# Patient Record
Sex: Female | Born: 1965 | Race: White | Hispanic: No | Marital: Married | Smoking: Never smoker
Health system: Southern US, Community
[De-identification: ages and names within clinical notes are randomized; demographics above are authoritative.]

## PROBLEM LIST (undated history)

## (undated) DIAGNOSIS — E785 Hyperlipidemia, unspecified: Secondary | ICD-10-CM

## (undated) DIAGNOSIS — R030 Elevated blood-pressure reading, without diagnosis of hypertension: Secondary | ICD-10-CM

## (undated) DIAGNOSIS — R7309 Other abnormal glucose: Secondary | ICD-10-CM

## (undated) HISTORY — DX: Hyperlipidemia, unspecified: E78.5

## (undated) HISTORY — DX: Elevated blood-pressure reading, without diagnosis of hypertension: R03.0

## (undated) HISTORY — DX: Other abnormal glucose: R73.09

---

## 1998-03-22 ENCOUNTER — Ambulatory Visit (HOSPITAL_COMMUNITY): Admission: RE | Admit: 1998-03-22 | Discharge: 1998-03-22 | Payer: Self-pay | Admitting: *Deleted

## 1998-08-27 ENCOUNTER — Other Ambulatory Visit: Admission: RE | Admit: 1998-08-27 | Discharge: 1998-08-27 | Payer: Self-pay | Admitting: Obstetrics & Gynecology

## 1999-01-30 ENCOUNTER — Other Ambulatory Visit: Admission: RE | Admit: 1999-01-30 | Discharge: 1999-01-30 | Payer: Self-pay | Admitting: Obstetrics & Gynecology

## 1999-05-02 ENCOUNTER — Ambulatory Visit (HOSPITAL_COMMUNITY): Admission: RE | Admit: 1999-05-02 | Discharge: 1999-05-02 | Payer: Self-pay | Admitting: Obstetrics and Gynecology

## 1999-05-02 ENCOUNTER — Encounter: Payer: Self-pay | Admitting: Obstetrics and Gynecology

## 1999-08-22 ENCOUNTER — Inpatient Hospital Stay (HOSPITAL_COMMUNITY): Admission: AD | Admit: 1999-08-22 | Discharge: 1999-08-25 | Payer: Self-pay | Admitting: Obstetrics & Gynecology

## 1999-10-06 ENCOUNTER — Other Ambulatory Visit: Admission: RE | Admit: 1999-10-06 | Discharge: 1999-10-06 | Payer: Self-pay | Admitting: Obstetrics & Gynecology

## 2001-05-24 ENCOUNTER — Other Ambulatory Visit: Admission: RE | Admit: 2001-05-24 | Discharge: 2001-05-24 | Payer: Self-pay | Admitting: Obstetrics & Gynecology

## 2001-05-24 ENCOUNTER — Other Ambulatory Visit: Admission: RE | Admit: 2001-05-24 | Discharge: 2001-05-24 | Payer: Self-pay | Admitting: Family Medicine

## 2001-09-14 ENCOUNTER — Ambulatory Visit (HOSPITAL_COMMUNITY): Admission: RE | Admit: 2001-09-14 | Discharge: 2001-09-14 | Payer: Self-pay | Admitting: Obstetrics & Gynecology

## 2001-11-22 ENCOUNTER — Inpatient Hospital Stay (HOSPITAL_COMMUNITY): Admission: AD | Admit: 2001-11-22 | Discharge: 2001-11-25 | Payer: Self-pay | Admitting: Obstetrics and Gynecology

## 2001-12-27 ENCOUNTER — Other Ambulatory Visit: Admission: RE | Admit: 2001-12-27 | Discharge: 2001-12-27 | Payer: Self-pay | Admitting: Obstetrics & Gynecology

## 2002-12-27 ENCOUNTER — Other Ambulatory Visit: Admission: RE | Admit: 2002-12-27 | Discharge: 2002-12-27 | Payer: Self-pay | Admitting: Obstetrics & Gynecology

## 2003-03-12 ENCOUNTER — Encounter: Payer: Self-pay | Admitting: Obstetrics & Gynecology

## 2003-03-12 ENCOUNTER — Encounter: Admission: RE | Admit: 2003-03-12 | Discharge: 2003-03-12 | Payer: Self-pay | Admitting: Obstetrics & Gynecology

## 2004-05-20 ENCOUNTER — Other Ambulatory Visit: Admission: RE | Admit: 2004-05-20 | Discharge: 2004-05-20 | Payer: Self-pay | Admitting: Obstetrics & Gynecology

## 2005-06-08 ENCOUNTER — Other Ambulatory Visit: Admission: RE | Admit: 2005-06-08 | Discharge: 2005-06-08 | Payer: Self-pay | Admitting: Obstetrics & Gynecology

## 2012-11-10 ENCOUNTER — Ambulatory Visit (HOSPITAL_COMMUNITY)
Admission: RE | Admit: 2012-11-10 | Discharge: 2012-11-10 | Disposition: A | Discharge status: Home / Self Care | Payer: 59 | Source: Ambulatory Visit | Attending: Internal Medicine | Admitting: Internal Medicine

## 2012-11-10 ENCOUNTER — Other Ambulatory Visit (HOSPITAL_COMMUNITY): Payer: Self-pay | Admitting: Internal Medicine

## 2012-11-10 DIAGNOSIS — K8020 Calculus of gallbladder without cholecystitis without obstruction: Secondary | ICD-10-CM

## 2012-11-10 DIAGNOSIS — R197 Diarrhea, unspecified: Secondary | ICD-10-CM | POA: Insufficient documentation

## 2012-11-10 DIAGNOSIS — R1032 Left lower quadrant pain: Secondary | ICD-10-CM

## 2012-11-10 DIAGNOSIS — R112 Nausea with vomiting, unspecified: Secondary | ICD-10-CM | POA: Insufficient documentation

## 2012-11-10 DIAGNOSIS — R1011 Right upper quadrant pain: Secondary | ICD-10-CM | POA: Insufficient documentation

## 2014-01-01 ENCOUNTER — Encounter: Payer: Self-pay | Admitting: Emergency Medicine

## 2014-01-01 ENCOUNTER — Ambulatory Visit (INDEPENDENT_AMBULATORY_CARE_PROVIDER_SITE_OTHER): Payer: 59 | Admitting: Emergency Medicine

## 2014-01-01 VITALS — BP 140/100 | HR 80 | Temp 98.0°F | Resp 18 | Ht 63.5 in | Wt 197.0 lb

## 2014-01-01 DIAGNOSIS — E785 Hyperlipidemia, unspecified: Secondary | ICD-10-CM

## 2014-01-01 DIAGNOSIS — R059 Cough, unspecified: Secondary | ICD-10-CM

## 2014-01-01 DIAGNOSIS — E782 Mixed hyperlipidemia: Secondary | ICD-10-CM

## 2014-01-01 DIAGNOSIS — R05 Cough: Secondary | ICD-10-CM

## 2014-01-01 DIAGNOSIS — R030 Elevated blood-pressure reading, without diagnosis of hypertension: Secondary | ICD-10-CM

## 2014-01-01 DIAGNOSIS — E559 Vitamin D deficiency, unspecified: Secondary | ICD-10-CM

## 2014-01-01 DIAGNOSIS — R6889 Other general symptoms and signs: Secondary | ICD-10-CM

## 2014-01-01 DIAGNOSIS — R7309 Other abnormal glucose: Secondary | ICD-10-CM

## 2014-01-01 LAB — CBC WITH DIFFERENTIAL/PLATELET
Basophils Absolute: 0.1 10*3/uL (ref 0.0–0.1)
Basophils Relative: 1 % (ref 0–1)
EOS PCT: 4 % (ref 0–5)
Eosinophils Absolute: 0.3 10*3/uL (ref 0.0–0.7)
HCT: 39.6 % (ref 36.0–46.0)
HEMOGLOBIN: 13.5 g/dL (ref 12.0–15.0)
LYMPHS ABS: 2.1 10*3/uL (ref 0.7–4.0)
LYMPHS PCT: 33 % (ref 12–46)
MCH: 28.4 pg (ref 26.0–34.0)
MCHC: 34.1 g/dL (ref 30.0–36.0)
MCV: 83.4 fL (ref 78.0–100.0)
MONO ABS: 0.7 10*3/uL (ref 0.1–1.0)
MONOS PCT: 10 % (ref 3–12)
Neutro Abs: 3.4 10*3/uL (ref 1.7–7.7)
Neutrophils Relative %: 52 % (ref 43–77)
Platelets: 366 10*3/uL (ref 150–400)
RBC: 4.75 MIL/uL (ref 3.87–5.11)
RDW: 13.9 % (ref 11.5–15.5)
WBC: 6.5 10*3/uL (ref 4.0–10.5)

## 2014-01-01 MED ORDER — PREDNISONE 10 MG PO TABS: ORAL_TABLET | ORAL | Status: DC

## 2014-01-01 MED ORDER — ALBUTEROL SULFATE HFA 108 (90 BASE) MCG/ACT IN AERS: 2.0000 | INHALATION_SPRAY | Freq: Four times a day (QID) | RESPIRATORY_TRACT | Status: DC | PRN

## 2014-01-01 MED ORDER — AZITHROMYCIN 250 MG PO TABS
ORAL_TABLET | ORAL | Status: AC
Start: 2014-01-01 — End: 2014-01-06

## 2014-01-01 MED ORDER — BENZONATATE 100 MG PO CAPS: 100.0000 mg | ORAL_CAPSULE | Freq: Three times a day (TID) | ORAL | Status: DC | PRN

## 2014-01-01 NOTE — Patient Instructions (Signed)
Bronchitis °Bronchitis is swelling (inflammation) of the air tubes leading to your lungs (bronchi). This causes mucus and a cough. If the swelling gets bad, you may have trouble breathing. °HOME CARE  °· Rest. °· Drink enough fluids to keep your pee (urine) clear or pale yellow (unless you have a condition where you have to watch how much you drink). °· Only take medicine as told by your doctor. If you were given antibiotic medicines, finish them even if you start to feel better. °· Avoid smoke, irritating chemicals, and strong smells. These make the problem worse. Quit smoking if you smoke. This helps your lungs heal faster. °· Use a cool mist humidifier. Change the water in the humidifier every day. You can also sit in the bathroom with hot shower running for 5 10 minutes. Keep the door closed. °· See your health care provider as told. °· Wash your hands often. °GET HELP IF: °Your problems do not get better after 1 week. °GET HELP RIGHT AWAY IF:  °· Your fever gets worse. °· You have chills. °· Your chest hurts. °· Your problems breathing get worse. °· You have blood in your mucus. °· You pass out (faint). °· You feel lightheaded. °· You have a bad headache. °· You throw up (vomit) again and again. °MAKE SURE YOU: °· Understand these instructions. °· Will watch your condition. °· Will get help right away if you are not doing well or get worse. °Document Released: 04/13/2008 Document Revised: 08/16/2013 Document Reviewed: 06/20/2013 °Exit[employer]® Patient Information ©2014 [employer]. ° °

## 2014-01-01 NOTE — Progress Notes (Signed)
   Subjective:    Patient ID: Kristie Burke, female    DOB: 1966-03-27, 48 y.o.   MRN: [id number]  HPI Comments: 48 yo female with increased chest/ head congestion over 1 week. Head has yellow/ green production. Chest feels like congestion worse in a.m. She admits to Tylenol cold/ FLU, Mucinex, Afrin no relief.   She did not f/u AD for recheck of labs from AUG 2014. LAST LABS TSH .345 BS 102 WBC 13.1 T 241 TG 174 L 135 A1C 5.3 INSULIN 43 D 34 She has added vitamins AD but did not start MF AD. She has been trying to eat better. She has been exercising more until recent illness. She had not been checking BP routinely.   Cough Associated symptoms include headaches and postnasal drip.  Headache  Associated symptoms include coughing.   MEDICATIONS Estradiol .5mg  MVIT  Allergies  Allergen Reactions  . Augmentin [Amoxicillin-Pot Clavulanate]     nausea  . Levaquin [Levofloxacin In D5w]     Palpitations    Past Medical History  Diagnosis Date  . Hyperlipidemia   . Other abnormal glucose       Review of Systems  HENT: Positive for congestion and postnasal drip.   Respiratory: Positive for cough.   Neurological: Positive for headaches.  All other systems reviewed and are negative.  BP 140/100  Pulse 80  Temp(Src) 98 F (36.7 C) (Temporal)  Resp 18  Ht 5' 3.5" (1.613 m)  Wt 197 lb (89.359 kg)  BMI 34.35 kg/m2  LMP 12/18/2013 Recheck 142/90     Objective:   Physical Exam  Nursing note and vitals reviewed. Constitutional: She is oriented to person, place, and time. She appears well-developed and well-nourished. No distress.  HENT:  Head: Normocephalic and atraumatic.  Right Ear: External ear normal.  Left Ear: External ear normal.  Nose: Nose normal.  Mouth/Throat: Oropharynx is clear and moist. No oropharyngeal exudate.  Cloudy TM's bilaterally   Eyes: Conjunctivae and EOM are normal.  Neck: Normal range of motion. Neck supple. No JVD present. No thyromegaly  present.  Cardiovascular: Normal rate, regular rhythm, normal heart sounds and intact distal pulses.   Pulmonary/Chest: Effort normal.  Tight/ congested cough ? Rhonchi  Abdominal: Soft. Bowel sounds are normal. She exhibits no distension and no mass. There is no tenderness. There is no rebound and no guarding.  Musculoskeletal: Normal range of motion. She exhibits no edema and no tenderness.  Lymphadenopathy:    She has no cervical adenopathy.  Neurological: She is alert and oriented to person, place, and time. No cranial nerve deficit.  Skin: Skin is warm and dry. No rash noted. No erythema. No pallor.  Psychiatric: She has a normal mood and affect. Her behavior is normal. Judgment and thought content normal.          Assessment & Plan:  1.  3 month F/U for elevated BP w/o HTN, Cholesterol, Pre-Dm, D. Deficient. Needs healthy diet, cardio QD and obtain healthy weight. Check Labs, Check BP if >130/80 call office 2. Cough ? Pneumonia/ Allergic rhinitis- Allegra OTC, increase H2o, allergy hygiene explained. ZPAK, Pred DP 10 mg Both AD, Tessalon perles AD, Albuterol HFA. Inhaler usage explained. D/C Afrin try Flonase AD. w/c if SX increase or ER.

## 2014-01-02 ENCOUNTER — Encounter: Payer: Self-pay | Admitting: Emergency Medicine

## 2014-01-02 DIAGNOSIS — R7309 Other abnormal glucose: Secondary | ICD-10-CM | POA: Insufficient documentation

## 2014-01-02 DIAGNOSIS — E785 Hyperlipidemia, unspecified: Secondary | ICD-10-CM | POA: Insufficient documentation

## 2014-01-02 LAB — HEPATIC FUNCTION PANEL
ALT: 18 U/L (ref 0–35)
AST: 20 U/L (ref 0–37)
Albumin: 4.3 g/dL (ref 3.5–5.2)
Alkaline Phosphatase: 84 U/L (ref 39–117)
Bilirubin, Direct: 0.1 mg/dL (ref 0.0–0.3)
Indirect Bilirubin: 0.3 mg/dL (ref 0.2–1.2)
Total Bilirubin: 0.4 mg/dL (ref 0.2–1.2)
Total Protein: 7.2 g/dL (ref 6.0–8.3)

## 2014-01-02 LAB — LIPID PANEL
Cholesterol: 215 mg/dL — ABNORMAL HIGH (ref 0–200)
HDL: 52 mg/dL (ref >39)
LDL Cholesterol: 120 mg/dL — ABNORMAL HIGH (ref 0–99)
Total CHOL/HDL Ratio: 4.1 Ratio
Triglycerides: 216 mg/dL — ABNORMAL HIGH (ref <150)
VLDL: 43 mg/dL — ABNORMAL HIGH (ref 0–40)

## 2014-01-02 LAB — HEMOGLOBIN A1C
Hgb A1c MFr Bld: 5.7 % — ABNORMAL HIGH (ref <5.7)
Mean Plasma Glucose: 117 mg/dL — ABNORMAL HIGH (ref <117)

## 2014-01-02 LAB — BASIC METABOLIC PANEL WITH GFR
BUN: 17 mg/dL (ref 6–23)
CO2: 32 mEq/L (ref 19–32)
Calcium: 9.2 mg/dL (ref 8.4–10.5)
Chloride: 103 mEq/L (ref 96–112)
Creat: 0.88 mg/dL (ref 0.50–1.10)
GFR, Est African American: 89 mL/min
GFR, Est Non African American: 77 mL/min
Glucose, Bld: 96 mg/dL (ref 70–99)
Potassium: 3.8 mEq/L (ref 3.5–5.3)
Sodium: 145 mEq/L (ref 135–145)

## 2014-01-02 LAB — MAGNESIUM: Magnesium: 2.3 mg/dL (ref 1.5–2.5)

## 2014-01-02 LAB — TSH: TSH: 1.686 u[IU]/mL (ref 0.350–4.500)

## 2014-01-02 LAB — INSULIN, FASTING: Insulin fasting, serum: 18 u[IU]/mL (ref 3–28)

## 2014-01-02 LAB — VITAMIN D 25 HYDROXY (VIT D DEFICIENCY, FRACTURES): Vit D, 25-Hydroxy: 44 ng/mL (ref 30–89)

## 2014-01-12 ENCOUNTER — Encounter: Payer: Self-pay | Admitting: *Deleted

## 2014-01-19 ENCOUNTER — Ambulatory Visit (INDEPENDENT_AMBULATORY_CARE_PROVIDER_SITE_OTHER): Payer: 59 | Admitting: Emergency Medicine

## 2014-01-19 ENCOUNTER — Encounter: Payer: Self-pay | Admitting: Emergency Medicine

## 2014-01-19 VITALS — BP 120/88 | HR 72 | Temp 98.0°F | Resp 16 | Ht 63.5 in | Wt 198.0 lb

## 2014-01-19 DIAGNOSIS — J309 Allergic rhinitis, unspecified: Secondary | ICD-10-CM

## 2014-01-19 DIAGNOSIS — R030 Elevated blood-pressure reading, without diagnosis of hypertension: Secondary | ICD-10-CM

## 2014-01-19 NOTE — Progress Notes (Signed)
   Subjective:    Patient ID: Kristie Burke, female    DOB: [birth year], 48 y.o.   MRN: [id number]  HPI Comments: 48 yo female for close f/u with BP. She notes 110s/ 80s at home. She denies any CV complaints. She has tried to improve diet/ exercise.   She is still with thick mucus but clear from chest. She has headaches on/ off in right side. She has runny nose mostly at work. She has continued Tessalon and increased H2o. She d/c nose spray. She notes + improvement from previous visit.    Medication List       This list is accurate as of: 01/19/14  9:40 AM.  Always use your most recent med list.               albuterol 108 (90 BASE) MCG/ACT inhaler  Commonly known as:  PROVENTIL HFA;VENTOLIN HFA  Inhale 2 puffs into the lungs every 6 (six) hours as needed for wheezing or shortness of breath.     benzonatate 100 MG capsule  Commonly known as:  TESSALON PERLES  Take 1 capsule (100 mg total) by mouth 3 (three) times daily as needed.     estradiol 2 MG tablet  Commonly known as:  ESTRACE  Take 2 mg by mouth 2 (two) times daily.     multivitamin with minerals tablet  Take 1 tablet by mouth daily.         Allergies  Allergen Reactions  . Augmentin [Amoxicillin-Pot Clavulanate]     nausea  . Levaquin [Levofloxacin In D5w]     Palpitations   Past Medical History  Diagnosis Date  . Hyperlipidemia   . Other abnormal glucose      Review of Systems  HENT: Positive for congestion and postnasal drip.   All other systems reviewed and are negative.   BP 120/88  Pulse 72  Temp(Src) 98 F (36.7 C) (Temporal)  Resp 16  Ht 5' 3.5" (1.613 m)  Wt 198 lb (89.812 kg)  BMI 34.52 kg/m2  LMP 12/18/2013     Objective:   Physical Exam  Nursing note and vitals reviewed. Constitutional: She is oriented to person, place, and time. She appears well-developed and well-nourished.  HENT:  Head: Normocephalic and atraumatic.  Right Ear: External ear normal.  Left Ear: External  ear normal.  Nose: Nose normal.  Mouth/Throat: Oropharynx is clear and moist. No oropharyngeal exudate.  Cloudy TM's bilaterally   Eyes: Conjunctivae and EOM are normal.  Neck: Normal range of motion.  Cardiovascular: Normal rate, regular rhythm, normal heart sounds and intact distal pulses.   Pulmonary/Chest: Effort normal and breath sounds normal.  Musculoskeletal: Normal range of motion.  Lymphadenopathy:    She has no cervical adenopathy.  Neurological: She is alert and oriented to person, place, and time.  Skin: Skin is warm and dry.  Psychiatric: She has a normal mood and affect. Judgment normal.          Assessment & Plan:  1. HTN- Check BP call if >130/80, increase cardio  2.Allergic rhinitis- Allegra OTC, increase H2o, allergy hygiene explained. Add Flonase QOD. If sinusitis symptoms continue may need further evaluation with ENT/ CT [facility]

## 2014-01-19 NOTE — Patient Instructions (Signed)
Allergic Rhinitis Allergic rhinitis is when the mucous membranes in the nose respond to allergens. Allergens are particles in the air that cause your body to have an allergic reaction. This causes you to release allergic antibodies. Through a chain of events, these eventually cause you to release histamine into the blood stream. Although meant to protect the body, it is this release of histamine that causes your discomfort, such as frequent sneezing, congestion, and an itchy, runny nose.  CAUSES  Seasonal allergic rhinitis (hay fever) is caused by pollen allergens that may come from grasses, trees, and weeds. Year-round allergic rhinitis (perennial allergic rhinitis) is caused by allergens such as house dust mites, pet dander, and mold spores.  SYMPTOMS   Nasal stuffiness (congestion).  Itchy, runny nose with sneezing and tearing of the eyes. DIAGNOSIS  Your health care provider can help you determine the allergen or allergens that trigger your symptoms. If you and your health care provider are unable to determine the allergen, skin or blood testing may be used. TREATMENT  Allergic Rhinitis does not have a cure, but it can be controlled by:  Medicines and allergy shots (immunotherapy).  Avoiding the allergen. Hay fever may often be treated with antihistamines in pill or nasal spray forms. Antihistamines block the effects of histamine. There are over-the-counter medicines that may help with nasal congestion and swelling around the eyes. Check with your health care provider before taking or giving this medicine.  If avoiding the allergen or the medicine prescribed do not work, there are many new medicines your health care provider can prescribe. Stronger medicine may be used if initial measures are ineffective. Desensitizing injections can be used if medicine and avoidance does not work. Desensitization is when a patient is given ongoing shots until the body becomes less sensitive to the allergen.  Make sure you follow up with your health care provider if problems continue. HOME CARE INSTRUCTIONS It is not possible to completely avoid allergens, but you can reduce your symptoms by taking steps to limit your exposure to them. It helps to know exactly what you are allergic to so that you can avoid your specific triggers. SEEK MEDICAL CARE IF:   You have a fever.  You develop a cough that does not stop easily (persistent).  You have shortness of breath.  You start wheezing.  Symptoms interfere with normal daily activities. Document Released: 07/21/2001 Document Revised: 08/16/2013 Document Reviewed: 07/03/2013 [employer] Patient Information 2014 [employer].  

## 2014-01-20 ENCOUNTER — Encounter: Payer: Self-pay | Admitting: Emergency Medicine

## 2014-01-20 DIAGNOSIS — R030 Elevated blood-pressure reading, without diagnosis of hypertension: Secondary | ICD-10-CM

## 2014-01-20 HISTORY — DX: Elevated blood-pressure reading, without diagnosis of hypertension: R03.0

## 2014-04-24 ENCOUNTER — Ambulatory Visit (INDEPENDENT_AMBULATORY_CARE_PROVIDER_SITE_OTHER): Payer: 59 | Admitting: Emergency Medicine

## 2014-04-24 ENCOUNTER — Encounter: Payer: Self-pay | Admitting: Emergency Medicine

## 2014-04-24 VITALS — BP 130/92 | HR 74 | Temp 97.8°F | Resp 18 | Ht 63.5 in | Wt 199.0 lb

## 2014-04-24 DIAGNOSIS — R030 Elevated blood-pressure reading, without diagnosis of hypertension: Secondary | ICD-10-CM

## 2014-04-24 DIAGNOSIS — R7309 Other abnormal glucose: Secondary | ICD-10-CM

## 2014-04-24 DIAGNOSIS — J309 Allergic rhinitis, unspecified: Secondary | ICD-10-CM

## 2014-04-24 DIAGNOSIS — E782 Mixed hyperlipidemia: Secondary | ICD-10-CM

## 2014-04-24 LAB — CBC WITH DIFFERENTIAL/PLATELET
BASOS ABS: 0 10*3/uL (ref 0.0–0.1)
Basophils Relative: 0 % (ref 0–1)
Eosinophils Absolute: 0.1 10*3/uL (ref 0.0–0.7)
Eosinophils Relative: 2 % (ref 0–5)
HCT: 39 % (ref 36.0–46.0)
HEMOGLOBIN: 13.4 g/dL (ref 12.0–15.0)
LYMPHS PCT: 31 % (ref 12–46)
Lymphs Abs: 1.9 10*3/uL (ref 0.7–4.0)
MCH: 29.2 pg (ref 26.0–34.0)
MCHC: 34.4 g/dL (ref 30.0–36.0)
MCV: 85 fL (ref 78.0–100.0)
MONOS PCT: 12 % (ref 3–12)
Monocytes Absolute: 0.7 10*3/uL (ref 0.1–1.0)
NEUTROS ABS: 3.4 10*3/uL (ref 1.7–7.7)
NEUTROS PCT: 55 % (ref 43–77)
Platelets: 324 10*3/uL (ref 150–400)
RBC: 4.59 MIL/uL (ref 3.87–5.11)
RDW: 14.9 % (ref 11.5–15.5)
WBC: 6.1 10*3/uL (ref 4.0–10.5)

## 2014-04-24 LAB — HEMOGLOBIN A1C
HEMOGLOBIN A1C: 5.5 % (ref <5.7)
MEAN PLASMA GLUCOSE: 111 mg/dL (ref <117)

## 2014-04-24 NOTE — Patient Instructions (Signed)
Fat and Cholesterol Control Diet  Fat and cholesterol levels in your blood and organs are influenced by your diet. High levels of fat and cholesterol may lead to diseases of the heart, small and large blood vessels, gallbladder, liver, and pancreas.  CONTROLLING FAT AND CHOLESTEROL WITH DIET  Although exercise and lifestyle factors are important, your diet is key. That is because certain foods are known to raise cholesterol and others to lower it. The goal is to balance foods for their effect on cholesterol and more importantly, to replace saturated and trans fat with other types of fat, such as monounsaturated fat, polyunsaturated fat, and omega-3 fatty acids.  On average, a person should consume no more than 15 to 17 g of saturated fat daily. Saturated and trans fats are considered "bad" fats, and they will raise LDL cholesterol. Saturated fats are primarily found in animal products such as meats, butter, and cream. However, that does not mean you need to give up all your favorite foods. Today, there are good tasting, low-fat, low-cholesterol substitutes for most of the things you like to eat. Choose low-fat or nonfat alternatives. Choose round or loin cuts of red meat. These types of cuts are lowest in fat and cholesterol. Chicken (without the skin), fish, veal, and ground turkey breast are great choices. Eliminate fatty meats, such as hot dogs and salami. Even shellfish have little or no saturated fat. Have a 3 oz (85 g) portion when you eat lean meat, poultry, or fish.  Trans fats are also called "partially hydrogenated oils." They are oils that have been scientifically manipulated so that they are solid at room temperature resulting in a longer shelf life and improved taste and texture of foods in which they are added. Trans fats are found in stick margarine, some tub margarines, cookies, crackers, and baked goods.   When baking and cooking, oils are a great substitute for butter. The monounsaturated oils are  especially beneficial since it is believed they lower LDL and raise HDL. The oils you should avoid entirely are saturated tropical oils, such as coconut and palm.   Remember to eat a lot from food groups that are naturally free of saturated and trans fat, including fish, fruit, vegetables, beans, grains (barley, rice, couscous, bulgur wheat), and pasta (without cream sauces).   IDENTIFYING FOODS THAT LOWER FAT AND CHOLESTEROL   Soluble fiber may lower your cholesterol. This type of fiber is found in fruits such as apples, vegetables such as broccoli, potatoes, and carrots, legumes such as beans, peas, and lentils, and grains such as barley. Foods fortified with plant sterols (phytosterol) may also lower cholesterol. You should eat at least 2 g per day of these foods for a cholesterol lowering effect.   Read package labels to identify low-saturated fats, trans fat free, and low-fat foods at the supermarket. Select cheeses that have only 2 to 3 g saturated fat per ounce. Use a heart-healthy tub margarine that is free of trans fats or partially hydrogenated oil. When buying baked goods (cookies, crackers), avoid partially hydrogenated oils. Breads and muffins should be made from whole grains (whole-wheat or whole oat flour, instead of "flour" or "enriched flour"). Buy non-creamy canned soups with reduced salt and no added fats.   FOOD PREPARATION TECHNIQUES   Never deep-fry. If you must fry, either stir-fry, which uses very little fat, or use non-stick cooking sprays. When possible, broil, bake, or roast meats, and steam vegetables. Instead of putting butter or margarine on vegetables, use lemon   and herbs, applesauce, and cinnamon (for squash and sweet potatoes). Use nonfat yogurt, salsa, and low-fat dressings for salads.   LOW-SATURATED FAT / LOW-FAT FOOD SUBSTITUTES  Meats / Saturated Fat (g)  · Avoid: Steak, marbled (3 oz/85 g) / 11 g  · Choose: Steak, lean (3 oz/85 g) / 4 g  · Avoid: Hamburger (3 oz/85 g) / 7  g  · Choose: Hamburger, lean (3 oz/85 g) / 5 g  · Avoid: Ham (3 oz/85 g) / 6 g  · Choose: Ham, lean cut (3 oz/85 g) / 2.4 g  · Avoid: Chicken, with skin, dark meat (3 oz/85 g) / 4 g  · Choose: Chicken, skin removed, dark meat (3 oz/85 g) / 2 g  · Avoid: Chicken, with skin, light meat (3 oz/85 g) / 2.5 g  · Choose: Chicken, skin removed, light meat (3 oz/85 g) / 1 g  Dairy / Saturated Fat (g)  · Avoid: Whole milk (1 cup) / 5 g  · Choose: Low-fat milk, 2% (1 cup) / 3 g  · Choose: Low-fat milk, 1% (1 cup) / 1.5 g  · Choose: Skim milk (1 cup) / 0.3 g  · Avoid: Hard cheese (1 oz/28 g) / 6 g  · Choose: Skim milk cheese (1 oz/28 g) / 2 to 3 g  · Avoid: Cottage cheese, 4% fat (1 cup) / 6.5 g  · Choose: Low-fat cottage cheese, 1% fat (1 cup) / 1.5 g  · Avoid: Ice cream (1 cup) / 9 g  · Choose: Sherbet (1 cup) / 2.5 g  · Choose: Nonfat frozen yogurt (1 cup) / 0.3 g  · Choose: Frozen fruit bar / trace  · Avoid: Whipped cream (1 tbs) / 3.5 g  · Choose: Nondairy whipped topping (1 tbs) / 1 g  Condiments / Saturated Fat (g)  · Avoid: Mayonnaise (1 tbs) / 2 g  · Choose: Low-fat mayonnaise (1 tbs) / 1 g  · Avoid: Butter (1 tbs) / 7 g  · Choose: Extra light margarine (1 tbs) / 1 g  · Avoid: Coconut oil (1 tbs) / 11.8 g  · Choose: Olive oil (1 tbs) / 1.8 g  · Choose: Corn oil (1 tbs) / 1.7 g  · Choose: Safflower oil (1 tbs) / 1.2 g  · Choose: Sunflower oil (1 tbs) / 1.4 g  · Choose: Soybean oil (1 tbs) / 2.4 g  · Choose: Canola oil (1 tbs) / 1 g  Document Released: 10/26/2005 Document Revised: 02/20/2013 Document Reviewed: 04/16/2011  [employer]® Patient Information ©2014 [employer].

## 2014-04-24 NOTE — Progress Notes (Signed)
Subjective:    Patient ID: Kristie Burke, female    DOB: February 07, 1966, 48 y.o.   MRN: [id number]  HPI Comments: 48 yo WF presents for 3 month F/U for HTN, Cholesterol, Pre-Dm, D. Deficient. She does not check BP routinely. She is eating healthier. She is keeping busy  She has increased allergy drainage over last week. She has been using saline. She has not been Flonase AD. She has been using Benadryl allergy without relief.  WBC             6.5   01/01/2014 HGB            13.5   01/01/2014 HCT            39.6   01/01/2014 PLT             366   01/01/2014 GLUCOSE          96   01/01/2014 CHOL            215   01/01/2014 TRIG            216   01/01/2014 HDL              52   01/01/2014 LDLCALC         120   01/01/2014 ALT              18   01/01/2014 AST              20   01/01/2014 NA              145   01/01/2014 K               3.8   01/01/2014 CL              103   01/01/2014 CREATININE     0.88   01/01/2014 BUN              17   01/01/2014 CO2              32   01/01/2014 TSH           1.686   01/01/2014 HGBA1C          5.7   01/01/2014   Hyperlipidemia  Hypertension     Medication List       This list is accurate as of: 04/24/14  9:20 AM.  Always use your most recent med list.               cetirizine 10 MG tablet  Commonly known as:  ZYRTEC  Take 10 mg by mouth daily.     estradiol 2 MG tablet  Commonly known as:  ESTRACE  Take 2 mg by mouth 2 (two) times daily.     multivitamin with minerals tablet  Take 1 tablet by mouth daily.       Allergies  Allergen Reactions  . Augmentin [Amoxicillin-Pot Clavulanate]     nausea  . Levaquin [Levofloxacin In D5w]     Palpitations   Past Medical History  Diagnosis Date  . Hyperlipidemia   . Other abnormal glucose   . Elevated blood pressure reading without diagnosis of hypertension 01/20/2014      Review of Systems  HENT: Positive for congestion and postnasal drip.   Respiratory: Positive for cough.   All other  systems reviewed and are negative.  BP 130/92  Pulse 74  Temp(Src) 97.8 F (36.6 C) (Temporal)  Resp 18  Ht 5' 3.5" (1.613 m)  Wt 199 lb (90.266 kg)  BMI 34.69 kg/m2  LMP 04/10/2014     Objective:   Physical Exam  Nursing note and vitals reviewed. Constitutional: She is oriented to person, place, and time. She appears well-developed and well-nourished. No distress.  HENT:  Head: Normocephalic and atraumatic.  Right Ear: External ear normal.  Left Ear: External ear normal.  Nose: Nose normal.  Mouth/Throat: Oropharynx is clear and moist. No oropharyngeal exudate.  Cloudy TM's bilaterally   Eyes: Conjunctivae and EOM are normal.  Neck: Normal range of motion. Neck supple. No JVD present. No thyromegaly present.  Cardiovascular: Normal rate, regular rhythm, normal heart sounds and intact distal pulses.   Pulmonary/Chest: Effort normal and breath sounds normal.  Abdominal: Soft. Bowel sounds are normal. She exhibits no distension. There is no tenderness.  Musculoskeletal: Normal range of motion. She exhibits no edema and no tenderness.  Lymphadenopathy:    She has no cervical adenopathy.  Neurological: She is alert and oriented to person, place, and time. No cranial nerve deficit.  Skin: Skin is warm and dry. No rash noted. No erythema. No pallor.  Psychiatric: She has a normal mood and affect. Her behavior is normal. Judgment and thought content normal.          Assessment & Plan:  1.  3 month F/U for HTN, Cholesterol, Pre-Dm, D. Deficient. Needs healthy diet, cardio QD and obtain healthy weight. Check Labs, Check BP if >130/80 call office   2. Allergic rhinitis- Zyrtec/ Allegra OTC, increase H2o, allergy hygiene explained. Add Flonase NS AD

## 2014-04-25 LAB — LIPID PANEL
Cholesterol: 258 mg/dL — ABNORMAL HIGH (ref 0–200)
HDL: 57 mg/dL (ref >39)
LDL CALC: 162 mg/dL — AB (ref 0–99)
Total CHOL/HDL Ratio: 4.5 Ratio
Triglycerides: 195 mg/dL — ABNORMAL HIGH (ref <150)
VLDL: 39 mg/dL (ref 0–40)

## 2014-04-25 LAB — BASIC METABOLIC PANEL WITH GFR
BUN: 21 mg/dL (ref 6–23)
CO2: 27 meq/L (ref 19–32)
Calcium: 9.4 mg/dL (ref 8.4–10.5)
Chloride: 104 mEq/L (ref 96–112)
Creat: 0.91 mg/dL (ref 0.50–1.10)
GFR, EST AFRICAN AMERICAN: 85 mL/min
GFR, Est Non African American: 74 mL/min
Glucose, Bld: 94 mg/dL (ref 70–99)
Potassium: 4.8 mEq/L (ref 3.5–5.3)
Sodium: 141 mEq/L (ref 135–145)

## 2014-04-25 LAB — HEPATIC FUNCTION PANEL
ALT: 12 U/L (ref 0–35)
AST: 17 U/L (ref 0–37)
Albumin: 4.1 g/dL (ref 3.5–5.2)
Alkaline Phosphatase: 65 U/L (ref 39–117)
BILIRUBIN DIRECT: 0.1 mg/dL (ref 0.0–0.3)
BILIRUBIN INDIRECT: 0.5 mg/dL (ref 0.2–1.2)
Total Bilirubin: 0.6 mg/dL (ref 0.2–1.2)
Total Protein: 6.9 g/dL (ref 6.0–8.3)

## 2014-04-25 LAB — INSULIN, FASTING: Insulin fasting, serum: 13 u[IU]/mL (ref 3–28)

## 2014-08-03 ENCOUNTER — Encounter: Payer: Self-pay | Admitting: Physician Assistant

## 2014-08-03 ENCOUNTER — Ambulatory Visit (INDEPENDENT_AMBULATORY_CARE_PROVIDER_SITE_OTHER): Payer: 59 | Admitting: Physician Assistant

## 2014-08-03 VITALS — BP 120/78 | HR 76 | Temp 98.1°F | Resp 16 | Ht 63.0 in | Wt 177.0 lb

## 2014-08-03 DIAGNOSIS — Z79899 Other long term (current) drug therapy: Secondary | ICD-10-CM | POA: Insufficient documentation

## 2014-08-03 DIAGNOSIS — E785 Hyperlipidemia, unspecified: Secondary | ICD-10-CM

## 2014-08-03 DIAGNOSIS — E669 Obesity, unspecified: Secondary | ICD-10-CM

## 2014-08-03 DIAGNOSIS — R7309 Other abnormal glucose: Secondary | ICD-10-CM

## 2014-08-03 DIAGNOSIS — R030 Elevated blood-pressure reading, without diagnosis of hypertension: Secondary | ICD-10-CM

## 2014-08-03 DIAGNOSIS — E559 Vitamin D deficiency, unspecified: Secondary | ICD-10-CM

## 2014-08-03 LAB — HEMOGLOBIN A1C
Hgb A1c MFr Bld: 5.7 % — ABNORMAL HIGH (ref <5.7)
Mean Plasma Glucose: 117 mg/dL — ABNORMAL HIGH (ref <117)

## 2014-08-03 LAB — LIPID PANEL
CHOLESTEROL: 169 mg/dL (ref 0–200)
HDL: 46 mg/dL (ref >39)
LDL Cholesterol: 99 mg/dL (ref 0–99)
Total CHOL/HDL Ratio: 3.7 Ratio
Triglycerides: 121 mg/dL (ref <150)
VLDL: 24 mg/dL (ref 0–40)

## 2014-08-03 LAB — BASIC METABOLIC PANEL WITH GFR
BUN: 9 mg/dL (ref 6–23)
CALCIUM: 9.1 mg/dL (ref 8.4–10.5)
CHLORIDE: 104 meq/L (ref 96–112)
CO2: 29 meq/L (ref 19–32)
Creat: 0.84 mg/dL (ref 0.50–1.10)
GFR, Est African American: >89 mL/min
GFR, Est Non African American: 81 mL/min
Glucose, Bld: 92 mg/dL (ref 70–99)
POTASSIUM: 4 meq/L (ref 3.5–5.3)
SODIUM: 143 meq/L (ref 135–145)

## 2014-08-03 LAB — HEPATIC FUNCTION PANEL
ALK PHOS: 49 U/L (ref 39–117)
ALT: 36 U/L — ABNORMAL HIGH (ref 0–35)
AST: 33 U/L (ref 0–37)
Albumin: 3.9 g/dL (ref 3.5–5.2)
BILIRUBIN TOTAL: 0.5 mg/dL (ref 0.2–1.2)
Bilirubin, Direct: 0.1 mg/dL (ref 0.0–0.3)
Indirect Bilirubin: 0.4 mg/dL (ref 0.2–1.2)
Total Protein: 6.5 g/dL (ref 6.0–8.3)

## 2014-08-03 LAB — CBC WITH DIFFERENTIAL/PLATELET
Basophils Absolute: 0 10*3/uL (ref 0.0–0.1)
Basophils Relative: 0 % (ref 0–1)
Eosinophils Absolute: 0.2 10*3/uL (ref 0.0–0.7)
Eosinophils Relative: 3 % (ref 0–5)
HCT: 38.1 % (ref 36.0–46.0)
HEMOGLOBIN: 12.8 g/dL (ref 12.0–15.0)
LYMPHS ABS: 1.6 10*3/uL (ref 0.7–4.0)
LYMPHS PCT: 27 % (ref 12–46)
MCH: 28.6 pg (ref 26.0–34.0)
MCHC: 33.6 g/dL (ref 30.0–36.0)
MCV: 85 fL (ref 78.0–100.0)
Monocytes Absolute: 0.7 10*3/uL (ref 0.1–1.0)
Monocytes Relative: 12 % (ref 3–12)
NEUTROS ABS: 3.5 10*3/uL (ref 1.7–7.7)
NEUTROS PCT: 58 % (ref 43–77)
Platelets: 347 10*3/uL (ref 150–400)
RBC: 4.48 MIL/uL (ref 3.87–5.11)
RDW: 14.9 % (ref 11.5–15.5)
WBC: 6 10*3/uL (ref 4.0–10.5)

## 2014-08-03 LAB — MAGNESIUM: Magnesium: 2.4 mg/dL (ref 1.5–2.5)

## 2014-08-03 LAB — TSH: TSH: 2.185 u[IU]/mL (ref 0.350–4.500)

## 2014-08-03 NOTE — Patient Instructions (Signed)
Almond crusted tilapia Blend almond with cocunut or almond flour, add paprika, italian, garlic, ground yellow mustard and bake it. Can dip in spicy mustard or put on salad.  Salmon 2 ways Marinate in bag 1) lite soy, ginger, honey pair with broccoli/green beads/edameme 2) lite soy, small amount honey, LIME juice, garlic, paprika, chilli powder- can pair with salsa/salad  Keep up the GREAT work

## 2014-08-03 NOTE — Progress Notes (Signed)
Assessment and Plan:  Hypertension: Continue medication, monitor blood pressure at home. Continue DASH diet. Cholesterol: Continue diet and exercise. Check cholesterol.  Pre-diabetes-Continue diet and exercise. Check A1C Vitamin D Def- check level and continue medications.  Obesity with co morbidities- long discussion about weight loss, diet, and exercise  Continue diet and meds as discussed. Further disposition pending results of labs.  HPI 48 y.o. female  presents for 3 month follow up with hypertension, hyperlipidemia, prediabetes and vitamin D. Her blood pressure has been controlled at home, today their BP is BP: 120/78 mmHg She does workout. She denies chest pain, shortness of breath, dizziness.  She is not on cholesterol medication and denies myalgias. Her cholesterol is not at goal. The cholesterol last visit was:   Lab Results  Component Value Date   CHOL 258* 04/24/2014   HDL 57 04/24/2014   LDLCALC 162* 04/24/2014   TRIG 195* 04/24/2014   CHOLHDL 4.5 04/24/2014   She has been working on diet and exercise for prediabetes, and denies paresthesia of the feet, polydipsia and polyuria. Last A1C in the office was:  Lab Results  Component Value Date   HGBA1C 5.5 04/24/2014   Patient is on Vitamin D supplement.   Lab Results  Component Value Date   VD25OH 44 01/01/2014     BMI is Body mass index is 31.36 kg/(m^2).,she is working on diet and exercise and has done well. She did a weight loss program.  Wt Readings from Last 3 Encounters:  08/03/14 177 lb (80.287 kg)  04/24/14 199 lb (90.266 kg)  01/19/14 198 lb (89.812 kg)    Current Medications:  Current Outpatient Prescriptions on File Prior to Visit  Medication Sig Dispense Refill  . cetirizine (ZYRTEC) 10 MG tablet Take 10 mg by mouth daily.      Marland Kitchen estradiol (ESTRACE) 2 MG tablet Take 2 mg by mouth 2 (two) times daily.      . Multiple Vitamins-Minerals (MULTIVITAMIN WITH MINERALS) tablet Take 1 tablet by mouth daily.        No current facility-administered medications on file prior to visit.   Medical History:  Past Medical History  Diagnosis Date  . Hyperlipidemia   . Other abnormal glucose   . Elevated blood pressure reading without diagnosis of hypertension 01/20/2014   Allergies:  Allergies  Allergen Reactions  . Augmentin [Amoxicillin-Pot Clavulanate]     nausea  . Levaquin [Levofloxacin In D5w]     Palpitations     Review of Systems:  = complains of   = denies  General: Fatigue  Fever  Chills  Weakness   Insomnia  Eyes: Redness  Blurred vision  Diplopia   ENT: Congestion  Sinus Pain  Post Nasal Drip  Sore Throat  Earache   Cardiac: Chest pain/pressure  SOB  Orthopnea   Palpitations   Paroxysmal nocturnal dyspnea[ ]  Claudication  Edema   Pulmonary: Cough  Wheezing[ ]   SOB   Snoring   GI: Nausea  Vomiting[ ]  Dysphagia[ ]  Heartburn[ ]  Abdominal pain  Constipation ; Diarrhea ; BRBPR  Melena[ ]  GU: Hematuria[ ]  Dysuria  Nocturia[ ]  Urgency   Hesitancy  Discharge  Neuro: Headaches[ ]  Vertigo[ ]  Paresthesias[ ]  Spasm  Speech changes  Incoordination   Ortho: Arthritis  Joint pain   Muscle pain  Joint swelling  Back Pain  Skin:  Rash   Pruritis  Change in skin lesion   Psych: Depression[ ]  Anxiety[ ]  Confusion  Memory loss   Heme/Lypmh: Bleeding  Bruising  Enlarged lymph nodes   Endocrine: Visual blurring  Paresthesia  Polyuria  Polydypsea    Heat/cold intolerance  Hypoglycemia   Family history- Review and unchanged Social history- Review and unchanged Physical Exam: BP 120/78  Pulse 76  Temp(Src) 98.1 F (36.7 C)  Resp 16  Ht  (1.6 m)  Wt 177 lb (80.287 kg)  BMI 31.36 kg/m2  LMP 07/20/2014 Wt Readings from Last 3 Encounters:  08/03/14 177 lb (80.287 kg)  04/24/14 199 lb (90.266 kg)  01/19/14 198 lb (89.812 kg)    General Appearance: Well nourished, in no apparent distress. Eyes: PERRLA, EOMs, conjunctiva no swelling or erythema Sinuses: No Frontal/maxillary tenderness ENT/Mouth: Ext aud canals clear, TMs without erythema, bulging. No erythema, swelling, or exudate on post pharynx.  Tonsils not swollen or erythematous. Hearing normal.  Neck: Supple, thyroid normal.  Respiratory: Respiratory effort normal, BS equal bilaterally without rales, rhonchi, wheezing or stridor.  Cardio: RRR with no MRGs. Brisk peripheral pulses without edema.  Abdomen: Soft, + BS.  Non tender, no guarding, rebound, hernias, masses. Lymphatics: Non tender without lymphadenopathy.  Musculoskeletal: Full ROM, 5/5 strength, normal gait.  Skin: Warm, dry without rashes, lesions, ecchymosis.  Neuro: Cranial nerves intact. Normal muscle tone, no cerebellar symptoms. Sensation intact.  Psych: Awake and oriented X 3, normal affect, Insight and Judgment appropriate.    [name] 9:06 AM

## 2014-08-04 LAB — VITAMIN D 25 HYDROXY (VIT D DEFICIENCY, FRACTURES): VIT D 25 HYDROXY: 36 ng/mL (ref 30–89)

## 2014-08-04 LAB — INSULIN, FASTING: Insulin fasting, serum: 3.1 u[IU]/mL (ref 2.0–19.6)

## 2014-10-08 ENCOUNTER — Ambulatory Visit (INDEPENDENT_AMBULATORY_CARE_PROVIDER_SITE_OTHER): Payer: 59 | Admitting: Physician Assistant

## 2014-10-08 ENCOUNTER — Encounter: Payer: Self-pay | Admitting: Physician Assistant

## 2014-10-08 VITALS — BP 146/98 | HR 72 | Temp 98.0°F | Resp 18 | Ht 63.5 in | Wt 178.0 lb

## 2014-10-08 DIAGNOSIS — J0100 Acute maxillary sinusitis, unspecified: Secondary | ICD-10-CM

## 2014-10-08 MED ORDER — FLUTICASONE PROPIONATE 50 MCG/ACT NA SUSP: 2.0000 | Freq: Every day | NASAL | Status: DC

## 2014-10-08 MED ORDER — PREDNISONE 20 MG PO TABS: ORAL_TABLET | ORAL | Status: AC

## 2014-10-08 MED ORDER — AZITHROMYCIN 250 MG PO TABS: ORAL_TABLET | ORAL | Status: AC

## 2014-10-08 NOTE — Progress Notes (Signed)
Subjective:    Patient ID: Kristie Burke, female    DOB: Apr 24, 1966, 48 y.o.   MRN: [id number]  Otalgia  There is pain in both ears. This is a new problem. Episode onset: Yesterday  The problem has been gradually worsening. Associated symptoms include abdominal pain, coughing, headaches and rhinorrhea. Pertinent negatives include no diarrhea, rash, sore throat or vomiting. Treatments tried: Allegra OTC and Benadryl and Ibuprofen and Mucinex.  Sinusitis This is a new problem. Episode onset: 3-4 weeks ago. Associated symptoms include congestion, coughing, ear pain, headaches and sinus pressure. Pertinent negatives include no chills, diaphoresis or sore throat.  Never smoked GFR= 81 on 08/03/14 Review of Systems  Constitutional: Positive for fatigue. Negative for fever, chills and diaphoresis.  HENT: Positive for congestion, ear pain, postnasal drip, rhinorrhea and sinus pressure. Negative for sore throat and trouble swallowing.   Eyes: Negative.   Respiratory: Positive for cough and chest tightness.        Cough with clear sputum   Cardiovascular: Negative.   Gastrointestinal: Positive for nausea and abdominal pain. Negative for vomiting, diarrhea and blood in stool.       Is having abdominal pain from spotting that started Thanksgiving day.  She states that he is still going through menopause and still gets spotting on and off.  Genitourinary: Negative.   Skin: Negative.  Negative for rash.  Allergic/Immunologic: Positive for environmental allergies.  Neurological: Positive for dizziness and headaches. Negative for light-headedness.   Past Medical History  Diagnosis Date  . Hyperlipidemia   . Other abnormal glucose   . Elevated blood pressure reading without diagnosis of hypertension 01/20/2014   Current Outpatient Prescriptions on File Prior to Visit  Medication Sig Dispense Refill  . estradiol (ESTRACE) 2 MG tablet Take 2 mg by mouth 2 (two) times daily.    . Multiple  Vitamins-Minerals (MULTIVITAMIN WITH MINERALS) tablet Take 1 tablet by mouth daily.     No current facility-administered medications on file prior to visit.   Allergies  Allergen Reactions  . Augmentin [Amoxicillin-Pot Clavulanate]     nausea  . Levaquin [Levofloxacin In D5w]     Palpitations     BP 146/98 mmHg  Pulse 72  Temp(Src) 98 F (36.7 C) (Temporal)  Resp 18  Ht 5' 3.5" (1.613 m)  Wt 178 lb (80.74 kg)  BMI 31.03 kg/m2  SpO2 99%  LMP 10/08/2014 Wt Readings from Last 3 Encounters:  10/08/14 178 lb (80.74 kg)  08/03/14 177 lb (80.287 kg)  04/24/14 199 lb (90.266 kg)    Objective:   Physical Exam  Constitutional: She is oriented to person, place, and time. She appears well-developed and well-nourished. She has a sickly appearance. No distress.  HENT:  Head: Normocephalic.  Right Ear: External ear and ear canal normal. Tympanic membrane is bulging.  Left Ear: External ear and ear canal normal. Tympanic membrane is bulging.  Nose: Mucosal edema and rhinorrhea present. Right sinus exhibits maxillary sinus tenderness. Right sinus exhibits no frontal sinus tenderness. Left sinus exhibits maxillary sinus tenderness. Left sinus exhibits no frontal sinus tenderness.  Mouth/Throat: Uvula is midline and mucous membranes are normal. Mucous membranes are not pale and not dry. No trismus in the jaw. No uvula swelling. Posterior oropharyngeal erythema present. No oropharyngeal exudate, posterior oropharyngeal edema or tonsillar abscesses.  TMs bulging bilaterally with clear fluid behind TMs.  TMs non-erythematous and non-edematous bilaterally. Turbinates erythematous bilaterally.  Eyes: Conjunctivae and lids are normal. Pupils are equal, round, and reactive to  light. Right eye exhibits no discharge. Left eye exhibits no discharge. No scleral icterus.  Neck: Normal range of motion. Neck supple. No tracheal deviation present.  Cardiovascular: Normal rate, regular rhythm, S1 normal, S2  normal, normal heart sounds and normal pulses.  Exam reveals no gallop, no distant heart sounds and no friction rub.   No murmur heard. Pulmonary/Chest: Effort normal and breath sounds normal. No stridor. No respiratory distress. She has no decreased breath sounds. She has no wheezes. She has no rhonchi. She has no rales. She exhibits no tenderness.  Abdominal: Soft. Bowel sounds are normal. There is no tenderness. There is no rebound and no guarding.  Lymphadenopathy:  No tenderness or LAD.  Neurological: She is alert and oriented to person, place, and time. Gait normal.  Skin: Skin is warm, dry and intact. No rash noted. She is not diaphoretic.  Psychiatric: She has a normal mood and affect. Her speech is normal and behavior is normal. Judgment and thought content normal. Cognition and memory are normal.  Vitals reviewed.     Assessment & Plan:  1. Acute maxillary sinusitis, recurrence not specified -Take Prednisone as prescribed for inflammation- predniSONE (DELTASONE) 20 MG tablet; Take 3 tablets PO QDaily for 3 days, then take 2 tablets PO QDaily for 3 days, then take 1 tablet PO QDaily for 3 days  Dispense: 18 tablet; Refill: 0 - Take Z-Pak as prescribed if you are not feeling better in 3-5 days-azithromycin (ZITHROMAX) 250 MG tablet; Take 2 tablets PO on day 1, then 1 tablet PO Q24H x 4 days  Dispense: 6 tablet; Refill: 0 - Take Flonase as prescribed- fluticasone (FLONASE) 50 MCG/ACT nasal spray; Place 2 sprays into both nostrils at bedtime.  Dispense: 16 g; Refill: 1 -Take Allegra OTC for allergies -Continue Mucinex OTC- follow directions on box.  Discussed medication effects and SE's.  Pt agreed to treatment plan. If you are not feeling better in 10-14 days, then please call the office. Please keep follow up appt on 02/01/15.  Bellamy Rubey, Lise AuerJennifer L, PA-C 12:23 PM [facility]

## 2014-10-08 NOTE — Patient Instructions (Signed)
- Take Allegra OTC for allergies and to help with fluid behind ear drums.   -While drinking fluids, pinch and hold nose close and swallow.  This will help open up your eustachian tubes to drain the fluid behind your ear drums. -Try steam showers to open your nasal passages.  Drink lots of water to stay hydrated and to thin mucous. -I sent in prescription for Flonase to help the inflammation.  Take 2 sprays in each nostril at bedtime.  Make sure you spray towards the outside of each nostril towards the outer corner of your eye, hold nose close and tilt head back.  This will help the medication get into your sinuses.  If you do not like this medication, then use saline nasal sprays same directions as above for Flonase.  -It can take up to 2 weeks to feel better.  Sinusitis is mostly caused by viruses.  Take Z-Pak in 3 days if you are not feeling better,  If you are not feeling better in 10-14 days, then please call the office.     Sinusitis Sinusitis is redness, soreness, and inflammation of the paranasal sinuses. Paranasal sinuses are air pockets within the bones of your face (beneath the eyes, the middle of the forehead, or above the eyes). In healthy paranasal sinuses, mucus is able to drain out, and air is able to circulate through them by way of your nose. However, when your paranasal sinuses are inflamed, mucus and air can become trapped. This can allow bacteria and other germs to grow and cause infection. Sinusitis can develop quickly and last only a short time (acute) or continue over a long period (chronic). Sinusitis that lasts for more than 12 weeks is considered chronic.  CAUSES  Causes of sinusitis include:  Allergies.  Structural abnormalities, such as displacement of the cartilage that separates your nostrils (deviated septum), which can decrease the air flow through your nose and sinuses and affect sinus drainage.  Functional abnormalities, such as when the small hairs (cilia)  that line your sinuses and help remove mucus do not work properly or are not present. SIGNS AND SYMPTOMS  Symptoms of acute and chronic sinusitis are the same. The primary symptoms are pain and pressure around the affected sinuses. Other symptoms include:  Upper toothache.  Earache.  Headache.  Bad breath.  Decreased sense of smell and taste.  A cough, which worsens when you are lying flat.  Fatigue.  Fever.  Thick drainage from your nose, which often is green and may contain pus (purulent).  Swelling and warmth over the affected sinuses. DIAGNOSIS  Your health care provider will perform a physical exam. During the exam, your health care provider may:  Look in your nose for signs of abnormal growths in your nostrils (nasal polyps).  Tap over the affected sinus to check for signs of infection.  View the inside of your sinuses (endoscopy) using an imaging device that has a light attached (endoscope). If your health care provider suspects that you have chronic sinusitis, one or more of the following tests may be recommended:  Allergy tests.  Nasal culture. A sample of mucus is taken from your nose, sent to a lab, and screened for bacteria.  Nasal cytology. A sample of mucus is taken from your nose and examined by your health care provider to determine if your sinusitis is related to an allergy. TREATMENT  Most cases of acute sinusitis are related to a viral infection and will resolve on their own within  10 days. Sometimes medicines are prescribed to help relieve symptoms (pain medicine, decongestants, nasal steroid sprays, or saline sprays).  However, for sinusitis related to a bacterial infection, your health care provider will prescribe antibiotic medicines. These are medicines that will help kill the bacteria causing the infection.  Rarely, sinusitis is caused by a fungal infection. In theses cases, your health care provider will prescribe antifungal medicine. For some cases  of chronic sinusitis, surgery is needed. Generally, these are cases in which sinusitis recurs more than 3 times per year, despite other treatments. HOME CARE INSTRUCTIONS   Drink plenty of water. Water helps thin the mucus so your sinuses can drain more easily.  Use a humidifier.  Inhale steam 3 to 4 times a day (for example, sit in the bathroom with the shower running).  Apply a warm, moist washcloth to your face 3 to 4 times a day, or as directed by your health care provider.  Use saline nasal sprays to help moisten and clean your sinuses.  Take medicines only as directed by your health care provider.  If you were prescribed either an antibiotic or antifungal medicine, finish it all even if you start to feel better. SEEK IMMEDIATE MEDICAL CARE IF:  You have increasing pain or severe headaches.  You have nausea, vomiting, or drowsiness.  You have swelling around your face.  You have vision problems.  You have a stiff neck.  You have difficulty breathing. MAKE SURE YOU:   Understand these instructions.  Will watch your condition.  Will get help right away if you are not doing well or get worse. Document Released: 10/26/2005 Document Revised: 03/12/2014 Document Reviewed: 11/10/2011 [facility] Patient Information 2015 [employer], [place]. This information is not intended to replace advice given to you by your health care provider. Make sure you discuss any questions you have with your health care provider.

## 2014-11-05 ENCOUNTER — Encounter: Payer: Self-pay | Admitting: Physician Assistant

## 2014-11-05 ENCOUNTER — Ambulatory Visit (INDEPENDENT_AMBULATORY_CARE_PROVIDER_SITE_OTHER): Payer: 59 | Admitting: Physician Assistant

## 2014-11-05 VITALS — BP 122/84 | HR 76 | Temp 97.8°F | Resp 16 | Ht 63.5 in | Wt 162.0 lb

## 2014-11-05 DIAGNOSIS — J0100 Acute maxillary sinusitis, unspecified: Secondary | ICD-10-CM

## 2014-11-05 MED ORDER — PREDNISONE 20 MG PO TABS: ORAL_TABLET | ORAL | Status: AC

## 2014-11-05 MED ORDER — DOXYCYCLINE HYCLATE 100 MG PO TABS: 100.0000 mg | ORAL_TABLET | Freq: Two times a day (BID) | ORAL | Status: AC

## 2014-11-05 NOTE — Progress Notes (Signed)
Subjective:    Patient ID: Kristie Burke, female    DOB: Feb 04, 1966, 48 y.o.   MRN: [id number]  Otalgia  There is pain in both (but mainly right ear) ears. This is a new problem. Episode onset: 1 week ago. The problem occurs constantly. Associated symptoms include abdominal pain, coughing, rhinorrhea and a sore throat. Pertinent negatives include no diarrhea, headaches, rash or vomiting. Treatments tried: Mucinex, alka seltzer plus, tylenol cold and flu.  Sore Throat  This is a new problem. Episode onset: 1 week ago. Associated symptoms include abdominal pain, congestion, coughing and ear pain. Pertinent negatives include no diarrhea, headaches, shortness of breath, trouble swallowing or vomiting.  Last seen 10/08/14 for sinus infection and was given prednisone and Z-Pak. Never Smoked GFR= 81 on 08/03/14 Review of Systems  Constitutional: Positive for chills, diaphoresis and fatigue. Negative for fever.  HENT: Positive for congestion, ear pain, postnasal drip, rhinorrhea, sinus pressure and sore throat. Negative for trouble swallowing.   Eyes: Negative.   Respiratory: Positive for cough. Negative for chest tightness, shortness of breath and wheezing.        Nonproductive cough  Cardiovascular: Negative.   Gastrointestinal: Positive for nausea and abdominal pain. Negative for vomiting, diarrhea and constipation.       Stomach aches upper abdomen  Genitourinary: Negative.   Skin: Negative for rash.  Neurological: Negative for dizziness, light-headedness and headaches.   Past Medical History  Diagnosis Date  . Hyperlipidemia   . Other abnormal glucose   . Elevated blood pressure reading without diagnosis of hypertension 01/20/2014   Current Outpatient Prescriptions on File Prior to Visit  Medication Sig Dispense Refill  . estradiol (ESTRACE) 2 MG tablet Take 2 mg by mouth 2 (two) times daily.    . fexofenadine (ALLEGRA) 180 MG tablet Take 180 mg by mouth daily.    . fluticasone  (FLONASE) 50 MCG/ACT nasal spray Place 2 sprays into both nostrils at bedtime. 16 g 1  . Multiple Vitamins-Minerals (MULTIVITAMIN WITH MINERALS) tablet Take 1 tablet by mouth daily.     No current facility-administered medications on file prior to visit.   Allergies  Allergen Reactions  . Augmentin [Amoxicillin-Pot Clavulanate]     nausea  . Levaquin [Levofloxacin In D5w]     Palpitations     BP 122/84 mmHg  Pulse 76  Temp(Src) 97.8 F (36.6 C) (Temporal)  Resp 16  Ht 5' 3.5" (1.613 m)  Wt 162 lb (73.483 kg)  BMI 28.24 kg/m2  SpO2 98%  LMP 11/04/2014 (Exact Date) Wt Readings from Last 3 Encounters:  11/05/14 162 lb (73.483 kg)  10/08/14 178 lb (80.74 kg)  08/03/14 177 lb (80.287 kg)   Objective:   Physical Exam  Constitutional: She is oriented to person, place, and time. She appears well-developed and well-nourished. She has a sickly appearance. No distress.  HENT:  Head: Normocephalic.  Right Ear: External ear and ear canal normal. Tympanic membrane is bulging.  Left Ear: External ear and ear canal normal. Tympanic membrane is bulging.  Nose: Mucosal edema and rhinorrhea present. Right sinus exhibits maxillary sinus tenderness and frontal sinus tenderness. Left sinus exhibits maxillary sinus tenderness and frontal sinus tenderness.  Mouth/Throat: Uvula is midline and mucous membranes are normal. Mucous membranes are not pale and not dry. No trismus in the jaw. No uvula swelling. Posterior oropharyngeal erythema present. No oropharyngeal exudate, posterior oropharyngeal edema or tonsillar abscesses.  TMs bulging with clear fluid and normal light reflexes bilaterally.  TMs non-erythematous and  non-edematous bilaterally. Turbinates erythematous bilaterally.  Eyes: Conjunctivae and lids are normal. Pupils are equal, round, and reactive to light. Right eye exhibits no discharge. Left eye exhibits no discharge. No scleral icterus.  Neck: Trachea normal, normal range of motion and  phonation normal. Neck supple. No tracheal tenderness present. No tracheal deviation present.  Cardiovascular: Normal rate, regular rhythm, S1 normal, S2 normal, normal heart sounds and normal pulses.  Exam reveals no gallop, no distant heart sounds and no friction rub.   No murmur heard. Pulmonary/Chest: Effort normal and breath sounds normal. No stridor. No respiratory distress. She has no decreased breath sounds. She has no wheezes. She has no rhonchi. She has no rales. She exhibits no tenderness.  Abdominal: Soft. Bowel sounds are normal. There is no tenderness. There is no rebound and no guarding.  Lymphadenopathy:  Tenderness in tonsils only, but no LAD.  Neurological: She is alert and oriented to person, place, and time. Gait normal.  Skin: Skin is warm, dry and intact. No rash noted. She is not diaphoretic. No pallor.  Psychiatric: She has a normal mood and affect. Her speech is normal and behavior is normal. Judgment and thought content normal. Cognition and memory are normal.  Vitals reviewed.  Assessment & Plan:  1. Acute maxillary sinusitis, recurrence not specified -Take Doxycycline as prescribed with food- doxycycline (VIBRA-TABS) 100 MG tablet; Take 1 tablet (100 mg total) by mouth 2 (two) times daily.  Dispense: 20 tablet; Refill: 0 - Take Prednisone as prescribed for inflammation-predniSONE (DELTASONE) 20 MG tablet; Take 3 tablets PO QDaily for 3 days, then take 2 tablets PO QDaily for 3 days, then take 1 tablet PO QDaily for 3 days  Dispense: 18 tablet; Refill: 0 -Take Mucinex-DM OTC- take 1 tablet BID -Continue Flonase and Allegra OTC -Continue Tessalon Perles as prescribed for cough  Discussed medication effects and SE's.  Pt agreed to treatment plan. If you are not feeling better in 10-14 days, then please call the office. Please follow up in 1-2 months for regular follow up with labs.   Emilyanne Mcgough, Lise AuerJennifer L, PA-C 2:21 PM [facility]

## 2014-11-05 NOTE — Addendum Note (Signed)
Addended by: Bertram SavinOUILLARD, Lulamae Skorupski L on: 11/05/2014 08:03 PM   Modules accepted: Kipp BroodSmartSet

## 2014-11-05 NOTE — Patient Instructions (Addendum)
-Take Doxycycline as prescribed with food. -Take Prednisone as prescribed for inflammation. -Continue Flonase as prescribed. -Take Mucinex for congestion.  Make sure you are drinking plenty of water to stay hydrated.  If you are not feeling better in 10-14 days, then please call the office.    Please schedule follow up in 1-2 months   Sinusitis Sinusitis is redness, soreness, and inflammation of the paranasal sinuses. Paranasal sinuses are air pockets within the bones of your face (beneath the eyes, the middle of the forehead, or above the eyes). In healthy paranasal sinuses, mucus is able to drain out, and air is able to circulate through them by way of your nose. However, when your paranasal sinuses are inflamed, mucus and air can become trapped. This can allow bacteria and other germs to grow and cause infection. Sinusitis can develop quickly and last only a short time (acute) or continue over a long period (chronic). Sinusitis that lasts for more than 12 weeks is considered chronic.  CAUSES  Causes of sinusitis include:  Allergies.  Structural abnormalities, such as displacement of the cartilage that separates your nostrils (deviated septum), which can decrease the air flow through your nose and sinuses and affect sinus drainage.  Functional abnormalities, such as when the small hairs (cilia) that line your sinuses and help remove mucus do not work properly or are not present. SIGNS AND SYMPTOMS  Symptoms of acute and chronic sinusitis are the same. The primary symptoms are pain and pressure around the affected sinuses. Other symptoms include:  Upper toothache.  Earache.  Headache.  Bad breath.  Decreased sense of smell and taste.  A cough, which worsens when you are lying flat.  Fatigue.  Fever.  Thick drainage from your nose, which often is green and may contain pus (purulent).  Swelling and warmth over the affected sinuses. DIAGNOSIS  Your health care provider  will perform a physical exam. During the exam, your health care provider may:  Look in your nose for signs of abnormal growths in your nostrils (nasal polyps).  Tap over the affected sinus to check for signs of infection.  View the inside of your sinuses (endoscopy) using an imaging device that has a light attached (endoscope). If your health care provider suspects that you have chronic sinusitis, one or more of the following tests may be recommended:  Allergy tests.  Nasal culture. A sample of mucus is taken from your nose, sent to a lab, and screened for bacteria.  Nasal cytology. A sample of mucus is taken from your nose and examined by your health care provider to determine if your sinusitis is related to an allergy. TREATMENT  Most cases of acute sinusitis are related to a viral infection and will resolve on their own within 10 days. Sometimes medicines are prescribed to help relieve symptoms (pain medicine, decongestants, nasal steroid sprays, or saline sprays).  However, for sinusitis related to a bacterial infection, your health care provider will prescribe antibiotic medicines. These are medicines that will help kill the bacteria causing the infection.  Rarely, sinusitis is caused by a fungal infection. In theses cases, your health care provider will prescribe antifungal medicine. For some cases of chronic sinusitis, surgery is needed. Generally, these are cases in which sinusitis recurs more than 3 times per year, despite other treatments. HOME CARE INSTRUCTIONS   Drink plenty of water. Water helps thin the mucus so your sinuses can drain more easily.  Use a humidifier.  Inhale steam 3 to 4 times  a day (for example, sit in the bathroom with the shower running).  Apply a warm, moist washcloth to your face 3 to 4 times a day, or as directed by your health care provider.  Use saline nasal sprays to help moisten and clean your sinuses.  Take medicines only as directed by your  health care provider.  If you were prescribed either an antibiotic or antifungal medicine, finish it all even if you start to feel better. SEEK IMMEDIATE MEDICAL CARE IF:  You have increasing pain or severe headaches.  You have nausea, vomiting, or drowsiness.  You have swelling around your face.  You have vision problems.  You have a stiff neck.  You have difficulty breathing. MAKE SURE YOU:   Understand these instructions.  Will watch your condition.  Will get help right away if you are not doing well or get worse. Document Released: 10/26/2005 Document Revised: 03/12/2014 Document Reviewed: 11/10/2011 [facility] Patient Information 2015 [place], [place]. This information is not intended to replace advice given to you by your health care provider. Make sure you discuss any questions you have with your health care provider.

## 2014-12-03 ENCOUNTER — Ambulatory Visit (INDEPENDENT_AMBULATORY_CARE_PROVIDER_SITE_OTHER): Payer: 59 | Admitting: Physician Assistant

## 2014-12-03 ENCOUNTER — Encounter: Payer: Self-pay | Admitting: Physician Assistant

## 2014-12-03 VITALS — BP 142/80 | HR 70 | Temp 98.2°F | Resp 18 | Ht 63.5 in | Wt 166.0 lb

## 2014-12-03 DIAGNOSIS — E669 Obesity, unspecified: Secondary | ICD-10-CM

## 2014-12-03 DIAGNOSIS — R7309 Other abnormal glucose: Secondary | ICD-10-CM

## 2014-12-03 DIAGNOSIS — E785 Hyperlipidemia, unspecified: Secondary | ICD-10-CM

## 2014-12-03 DIAGNOSIS — R030 Elevated blood-pressure reading, without diagnosis of hypertension: Secondary | ICD-10-CM

## 2014-12-03 DIAGNOSIS — R7303 Prediabetes: Secondary | ICD-10-CM

## 2014-12-03 DIAGNOSIS — E559 Vitamin D deficiency, unspecified: Secondary | ICD-10-CM

## 2014-12-03 DIAGNOSIS — Z79899 Other long term (current) drug therapy: Secondary | ICD-10-CM

## 2014-12-03 LAB — CBC WITH DIFFERENTIAL/PLATELET
Basophils Absolute: 0.1 10*3/uL (ref 0.0–0.1)
Basophils Relative: 1 % (ref 0–1)
Eosinophils Absolute: 0.3 10*3/uL (ref 0.0–0.7)
Eosinophils Relative: 5 % (ref 0–5)
HCT: 37.9 % (ref 36.0–46.0)
Hemoglobin: 12.5 g/dL (ref 12.0–15.0)
Lymphocytes Relative: 36 % (ref 12–46)
Lymphs Abs: 1.9 10*3/uL (ref 0.7–4.0)
MCH: 29.1 pg (ref 26.0–34.0)
MCHC: 33 g/dL (ref 30.0–36.0)
MCV: 88.1 fL (ref 78.0–100.0)
MPV: 10.4 fL (ref 8.6–12.4)
Monocytes Absolute: 0.6 10*3/uL (ref 0.1–1.0)
Monocytes Relative: 11 % (ref 3–12)
Neutro Abs: 2.5 10*3/uL (ref 1.7–7.7)
Neutrophils Relative %: 47 % (ref 43–77)
Platelets: 362 10*3/uL (ref 150–400)
RBC: 4.3 MIL/uL (ref 3.87–5.11)
RDW: 15.9 % — ABNORMAL HIGH (ref 11.5–15.5)
WBC: 5.4 10*3/uL (ref 4.0–10.5)

## 2014-12-03 LAB — HEPATIC FUNCTION PANEL
ALT: 17 U/L (ref 0–35)
AST: 22 U/L (ref 0–37)
Albumin: 3.8 g/dL (ref 3.5–5.2)
Alkaline Phosphatase: 66 U/L (ref 39–117)
BILIRUBIN INDIRECT: 0.3 mg/dL (ref 0.2–1.2)
Bilirubin, Direct: 0.1 mg/dL (ref 0.0–0.3)
Total Bilirubin: 0.4 mg/dL (ref 0.2–1.2)
Total Protein: 6.4 g/dL (ref 6.0–8.3)

## 2014-12-03 LAB — BASIC METABOLIC PANEL WITH GFR
BUN: 15 mg/dL (ref 6–23)
CO2: 31 mEq/L (ref 19–32)
Calcium: 8.6 mg/dL (ref 8.4–10.5)
Chloride: 106 mEq/L (ref 96–112)
Creat: 0.83 mg/dL (ref 0.50–1.10)
GFR, Est African American: >89 mL/min
GFR, Est Non African American: 82 mL/min
Glucose, Bld: 73 mg/dL (ref 70–99)
Potassium: 4.1 mEq/L (ref 3.5–5.3)
Sodium: 144 mEq/L (ref 135–145)

## 2014-12-03 LAB — LIPID PANEL
Cholesterol: 230 mg/dL — ABNORMAL HIGH (ref 0–200)
HDL: 63 mg/dL (ref >39)
LDL Cholesterol: 133 mg/dL — ABNORMAL HIGH (ref 0–99)
Total CHOL/HDL Ratio: 3.7 Ratio
Triglycerides: 169 mg/dL — ABNORMAL HIGH (ref <150)
VLDL: 34 mg/dL (ref 0–40)

## 2014-12-03 LAB — HEMOGLOBIN A1C
Hgb A1c MFr Bld: 5.6 % (ref <5.7)
Mean Plasma Glucose: 114 mg/dL (ref <117)

## 2014-12-03 NOTE — Progress Notes (Addendum)
HPI A Caucasian 49 y.o. female  presents for 4 month follow up with hyperlipidemia, prediabetes and vitamin D.  Last visit with labs was 08/03/14.  Patient was seen on 10/08/14 for sinus infection and treated with prednisone, Z-Pak, Flonase, Allegra and Mucinex.  Patient was then seen on 11/05/14 for sinus infection and treated with Doxycycline, prednisone, Mucinex, Flonase, Allegra and Tessalon Perles.  Patient states she does not like the Allegra due to noticing hair falling out.  She is currently taking Benadryl OTC.  She reports that she was feeling better, but keeps getting the sinus infections.  Patient reports headache, sinus pressure and ear congestion.  Patient has not seen ENT doctor before.    Her blood pressure has been controlled at home, today their BP is BP: (!) 142/80 mmHg. She does workout and does a lot of walking. She denies chest pain, shortness of breath, dizziness.   She is not on cholesterol medication and denies myalgias. Her cholesterol is at goal. The cholesterol last visit was:   Lab Results  Component Value Date   CHOL 169 08/03/2014   HDL 46 08/03/2014   LDLCALC 99 08/03/2014   TRIG 121 08/03/2014   CHOLHDL 3.7 08/03/2014   She has been working on diet and exercise for prediabetes, and denies increased appetite, polydipsia and polyuria. Patient has had prediabetes since February 2015.  Last A1C in the office was:  Lab Results  Component Value Date   HGBA1C 5.7* 08/03/2014  Currently manages prediabetes with?   Diet and exercise.   Number of meals per day? 3   B- scramble egg or boil egg and piece of toast, bacon  L- salad  D- chicken, red meat occasionally, salad or squash  Snacks? Apple or orange   Beverages? Water, green tea   Patient is on Vitamin D supplement. - multivitamin   Lab Results  Component Value Date   VD25OH 36 08/03/2014     ROS- Review of Systems  Constitutional: Negative.  Negative for fever, chills, malaise/fatigue and diaphoresis.   HENT: Positive for congestion. Negative for ear discharge, ear pain and sore throat.        Ear congestion and sinus pressure.  Eyes: Negative.   Respiratory: Negative.  Negative for cough, sputum production, shortness of breath and wheezing.   Cardiovascular: Negative.  Negative for chest pain and leg swelling.  Gastrointestinal: Negative.  Negative for nausea, vomiting, abdominal pain, diarrhea and constipation.  Genitourinary: Negative.  Negative for dysuria, urgency and frequency.  Musculoskeletal: Negative.   Skin: Negative.  Negative for rash.  Neurological: Positive for headaches. Negative for dizziness.  Psychiatric/Behavioral: Negative.    Medical History:  Past Medical History  Diagnosis Date  . Hyperlipidemia   . Other abnormal glucose   . Elevated blood pressure reading without diagnosis of hypertension 01/20/2014   Current Medications:  Current Outpatient Prescriptions on File Prior to Visit  Medication Sig Dispense Refill  . estradiol (ESTRACE) 2 MG tablet Take 2 mg by mouth 2 (two) times daily.    . Multiple Vitamins-Minerals (MULTIVITAMIN WITH MINERALS) tablet Take 1 tablet by mouth daily.    . fluticasone (FLONASE) 50 MCG/ACT nasal spray Place 2 sprays into both nostrils at bedtime. (Patient not taking: Reported on 12/03/2014) 16 g 1   No current facility-administered medications on file prior to visit.   Allergies:  Allergies  Allergen Reactions  . Augmentin [Amoxicillin-Pot Clavulanate]     nausea  . Levaquin [Levofloxacin In D5w]  Palpitations    Family history- Review and unchanged Social history- Review and unchanged  Physical Exam: BP 142/80 mmHg  Pulse 70  Temp(Src) 98.2 F (36.8 C) (Temporal)  Resp 18  Ht 5' 3.5" (1.613 m)  Wt 166 lb (75.297 kg)  BMI 28.94 kg/m2  SpO2 99%  LMP 11/04/2014 (Exact Date) Wt Readings from Last 3 Encounters:  12/03/14 166 lb (75.297 kg)  11/05/14 162 lb (73.483 kg)  10/08/14 178 lb (80.74 kg)  Vitals  Reviewed. General Appearance: Well nourished, in no apparent distress. Eyes: PERRLA, EOMIs, conjunctiva no swelling or erythema.  No scleral icterus. Sinuses: Maxillary tenderness upon palpation.  No Frontal tenderness. ENT/Mouth: External auditory canals clear, TMs intact with bulging with clear fluid bilaterally.  TMs non-erythematous and non-edematous bilaterally.  No erythema, edema, or exudate on posterior pharynx.  Tonsils not swollen or erythematous. Hearing normal.  Neck: Supple, no JVD, no carotid bruits, thyroid normal.  Respiratory: Respiratory effort normal, CTAB.  No w/r/r or stridor.  Cardio: RRR.  m/r/g. S1S2nl. Brisk peripheral pulses without edema.  Abdomen: Soft, + normal BS.  Non tender, no guarding, rebound, hernias, masses. Lymphatics: Non tender without lymphadenopathy.  Musculoskeletal: Full ROM, 5/5 strength, normal gait.  Skin: Warm, dry intact without rashes, lesions, ecchymosis.  Neuro: Cranial nerves intact. No cerebellar symptoms. Sensation intact.  Psych: Awake and oriented X 3, normal affect, Insight and Judgment appropriate.   Assessment and Plan:  1. Elevated blood pressure reading without diagnosis of hypertension Monitor blood pressure at home.  Please let us know if readings above 140/90.  Reminder to go to the ER if any CP, SOB, nausea, dizziness, severe HA, changes vision/speech, left arm numbness and tingling, and jaw pain. - CBC with Differential/Platelet - BASIC METABOLIC PANEL WITH GFR - Hepatic function panel  2. Hyperlipidemia Please follow recommended diet and exercise.  Check cholesterol. - Lipid panel  3. Prediabetes Please follow recommended diet and exercise.  Check A1C and Insulin levels. - Hemoglobin A1c - Insulin, fasting  4. Vitamin D deficiency Continue multivitamin as prescribed.  Check vitamin D level. - Vit D  25 hydroxy (rtn osteoporosis monitoring)  5. Obesity Please follow recommended diet and exercise.    6. Encounter  for long-term (current) use of medications Will monitor kidney and liver function. - CBC with Differential/Platelet - BASIC METABOLIC PANEL WITH GFR - Hepatic function panel  7. Chronic Sinusitis Continue Flonase and OTC allergy pill.  Want to refer patient to Dr. Narda Bonds, but patient wants to hold off on referral right now.  Continue diet and meds as discussed. Further disposition pending results of labs. Discussed medication effects and SE's.  Pt agreed to treatment plan. Please keep your physical appt on 03/04/15.   Cohl Behrens, Lise Auer, PA-C 9:02 AM [facility]

## 2014-12-03 NOTE — Patient Instructions (Signed)
-   Continue medications as prescribed. -Take Flonase and Allergy pill over the counter.    Please follow up in 3 months for physical.   GIVE PT FOOD CHOICE LISTS FOR MEAL PLANNING  1)The amount of food you eat is important  -Too much can increase glucose levels and cause you to gain weight (which can  also increase glucose levels.  -Too little can decrease glucose level to unsafe levels (<70) 2)Eat meals and snacks at the same time each day to help your diabetes medication help you.  If you eat at different times each day, then the medication will not be as effective. 3)Do NOT skip meals or eat meals later than usual.  If you skip meals, then your glucose level can go low (<70).  Eating meals later than usual will not help the medication work effectively. 4) Amount of carbohydrates (carbs) per meal  -Breakfast- 30-45 grams of carbs  -Lunch and Dinner- 45-60 grams of carbs  -Snacks- 15-30 grams of carbs 5)Low fat foods have no more than 3 grams of fat per serving.  -Saturated- look for less than 1 gram per serving  -Trans Fat- look for 0 grams per serving 6)Exercise at least 120 minutes per week  Exercise Benefits:   -Lower LDL (Bad cholesterol)   -Lower blood pressure   -Increase HDL (Good cholesterol)   -Strengthen heart, lungs and muscles   -Burn calories and relieve stress   -Sleep better and help you feel better overall  To lower your risk of heart disease, limit your intake of saturated fat and trans fat as much as possible. THE GOOD WHAT IT DOES WHERE IT'S FOUND  MONOUNSATURATED FAT Lowers LDL and maybe raises HDL cholesterol Canola oil, olives, olive oil, peanuts, peanut oil, avocados, nuts  POLYUNSATURATED FAT Lowers LDL cholesterol Corn, safflower, sunflower and soybean oils, nuts, seeds  OMEGA-3 FATTY ACIDS Lowers triglycerides (blood fats) and blood pressure Salmon, mackerel, herring, sardines, flax seed, flaxseed oil, walnuts, soybean oil  THE BAD WHAT IT DOES WHERE IT'S  FOUND  SATURATED FAT Raises LDL (bad) cholesterol Butter, shortening, lard, red meat, cheese, whole milk, ice cream, coconut and palm oils  TRANS FAT Raises LDL cholesterol, lowers HDL (good) cholesterol Fried foods, some stick margarines, some cookies and crackers (look for hydrogenated fat on the ingredient list)  CHOLESTEROL FROM FOOD Too much may raise cholesterol levels Meat, poultry, seafood, eggs, milk, cheese, yogurt, butter   Plate Method (How much food of each food group) 1) Fill one half of your plate with nonstarchy vegetables: lettuce, broccoli, green beans, spinach, carrots or peppers. 2) Fill one quarter with protein: chicken, [place], fish, lean meat, eggs or tofu. 3) Fill one quarter with a nutritious carbohydrate food: brown rice, whole-wheat pasta, whole-wheat bread, peas, or corn.  Choose whole-wheat carbs for extra nutrition.  Controlling carbs helps you control your blood glucose. 4) Include a small piece of fruit at each meal, as well as 8 ounces of lowfat milk or yogurt. 5) Add 1-2 teaspoons of heart-healthy fat, such as olive or canola oil, trans fat-free margarine, avocado, nuts or seeds.

## 2014-12-04 LAB — VITAMIN D 25 HYDROXY (VIT D DEFICIENCY, FRACTURES): Vit D, 25-Hydroxy: 22 ng/mL — ABNORMAL LOW (ref 30–100)

## 2014-12-04 LAB — INSULIN, FASTING: Insulin fasting, serum: 5.6 u[IU]/mL (ref 2.0–19.6)

## 2015-02-01 ENCOUNTER — Ambulatory Visit: Payer: Self-pay | Admitting: Physician Assistant

## 2015-02-08 ENCOUNTER — Ambulatory Visit: Payer: Self-pay | Admitting: Physician Assistant

## 2015-03-04 ENCOUNTER — Encounter: Payer: Self-pay | Admitting: Physician Assistant

## 2015-07-30 ENCOUNTER — Ambulatory Visit (INDEPENDENT_AMBULATORY_CARE_PROVIDER_SITE_OTHER): Payer: 59 | Admitting: Internal Medicine

## 2015-07-30 VITALS — BP 152/90 | Temp 97.7°F | Wt 196.0 lb

## 2015-07-30 DIAGNOSIS — J069 Acute upper respiratory infection, unspecified: Secondary | ICD-10-CM

## 2015-07-30 DIAGNOSIS — J0100 Acute maxillary sinusitis, unspecified: Secondary | ICD-10-CM

## 2015-07-30 MED ORDER — FLUTICASONE PROPIONATE 50 MCG/ACT NA SUSP: 2.0000 | Freq: Every day | NASAL | Status: DC

## 2015-07-30 MED ORDER — PREDNISONE 20 MG PO TABS: ORAL_TABLET | ORAL | Status: DC

## 2015-07-30 MED ORDER — AZITHROMYCIN 250 MG PO TABS: ORAL_TABLET | ORAL | Status: DC

## 2015-07-30 MED ORDER — BENZONATATE 200 MG PO CAPS: 200.0000 mg | ORAL_CAPSULE | Freq: Three times a day (TID) | ORAL | Status: DC | PRN

## 2015-07-30 NOTE — Progress Notes (Signed)
Patient ID: Kristie Ochs, female   DOB: 02/17/1966, 49 y.o.   MRN: [id number]  HPI  Patient presents to the office for evaluation of cough.  It has been going on for 1 weeks.  Patient reports all the time, wet, dry, worse with lying down.  They also endorse change in voice, chills, postnasal drip and nasal congestion, yellow green nasal sputum, itchy water eyes, right ear fullness, sore throat.  .  They have tried tylenol sinus and head no relief..  They report that nothing has worked.  They admits to other sick contacts.  She does typically get bad seasonal allergies.    Review of Systems  Constitutional: Positive for chills. Negative for fever and malaise/fatigue.  HENT: Positive for congestion, ear pain and sore throat.   Respiratory: Positive for cough. Negative for sputum production, shortness of breath and wheezing.   Cardiovascular: Negative for chest pain, palpitations and leg swelling.  Neurological: Positive for headaches.    PE:  General:  Alert and non-toxic, WDWN, NAD HEENT: NCAT, PERLA, EOM normal, no occular discharge or erythema.  Nasal mucosal edema with sinus tenderness to palpation.  Oropharynx clear with minimal oropharyngeal edema and erythema.  Mucous membranes moist and pink. Neck:  Cervical adenopathy Chest:  RRR no MRGs.  Lungs clear to auscultation A&P with no wheezes rhonchi or rales.   Abdomen: +BS x 4 quadrants, soft, non-tender, no guarding, rigidity, or rebound. Skin: warm and dry no rash Neuro: A&Ox4, CN II-XII grossly intact  Assessment and Plan:   1. Acute maxillary sinusitis, recurrence not specified  - fluticasone (FLONASE) 50 MCG/ACT nasal spray; Place 2 sprays into both nostrils at bedtime.  Dispense: 16 g; Refill: 1  2. Acute URI - fluticasone (FLONASE) 50 MCG/ACT nasal spray; Place 2 sprays into both nostrils at bedtime.  Dispense: 16 g; Refill: 1 - predniSONE (DELTASONE) 20 MG tablet; 3 tabs po daily x 3 days, then 2 tabs x 3 days, then 1.5  tabs x 3 days, then 1 tab x 3 days, then 0.5 tabs x 3 days  Dispense: 27 tablet; Refill: 0 - benzonatate (TESSALON) 200 MG capsule; Take 1 capsule (200 mg total) by mouth 3 (three) times daily as needed for cough.  Dispense: 30 capsule; Refill: 1 - azithromycin (ZITHROMAX) 250 MG tablet; Take 2 tablets (500 mg) on  Day 1,  followed by 1 tablet (250 mg) once daily on Days 2 through 5.  Dispense: 6 each; Refill: 1  Mucinex, nasal saline, antihistamine

## 2015-07-30 NOTE — Patient Instructions (Signed)
Sinusitis can be uncomfortable. People with sinusitis have congestion with yellow/green/gray discharge, sinus pain/pressure, pain around the eyes. Sinus infections almost ALWAYS stem from a viral infection and antibiotics don't work against a virus. Even when bacteria is responsible, the infections usually clear up on their own in a week or so.   PLEASE TRY TO DO OVER THE COUNTER TREATMENT AND PREDNISONE FOR 5-7 DAYS AND IF YOU ARE NOT GETTING BETTER OR GETTING WORSE THEN YOU CAN START ON AN ANTIBIOTIC GIVEN.  Can take the prednisone AT NIGHT WITH DINNER, it take 8-12 hours to start working so it will NOT affect your sleeping if you take it at night with your food!! Take two pills the first night and 1 or two pill the second night and then 1 pill the other nights.   Risk of antibiotic use: About 1 in 4 people who take antibiotics have side effects including stomach problems, dizziness, or rashes. Those problems clear up soon after stopping the drugs, but in rare cases antibiotics can cause severe allergic reaction. Over use of antibiotics also encourages the growth of bacteria that can't be controlled easily with drugs. That makes you more vunerable to antibiotic-resistant infections and undermines the benefits of antibiotics for others.   Waste of Money: Antibiotics often aren't very expensive, but any money spent on unnecessary drugs is money down the drain.   When are antibiotics needed? Only when symptoms last longer than a week.  Start to improve but then worsen again  -It can take up to 2 weeks to feel better.   -If you do not get better in 7-10 days (Have fever, facial pain, dental pain and swelling), then please call the office and it is now appropriate to start an antibiotic.   -Please take Tylenol or Ibuprofen for pain. -Acetaminiphen 325mg orally every 4-6 hours for pain.  Max: 10 per day -Ibuprofen 200mg orally every 6-8 hours for pain.  Take with food to avoid ulcers.   Max 10 per  day  Please pick one of the over the counter allergy medications below and take it once daily for allergies.  Claritin or loratadine cheapest but likely the weakest  Zyrtec or certizine at night because it can make you sleepy The strongest is allegra or fexafinadine  Cheapest at [employer], [employer], [employer]  -While drinking fluids, pinch and hold nose close and swallow.  This will help open up your eustachian tubes to drain the fluid behind your ear drums. -Try steam showers to open your nasal passages.   Drink lots of water to stay hydrated and to thin mucous.  Flonase/Nasonex is to help the inflammation.  Take 2 sprays in each nostril at bedtime.  Make sure you spray towards the outside of each nostril towards the outer corner of your eye, hold nose close and tilt head back.  This will help the medication get into your sinuses.  If you do not like this medication, then use saline nasal sprays same directions as above for Flonase. Stop the medication right away if you get blurring of your vision or nose bleeds.  Sinusitis Sinusitis is redness, soreness, and inflammation of the paranasal sinuses. Paranasal sinuses are air pockets within the bones of your face (beneath the eyes, the middle of the forehead, or above the eyes). In healthy paranasal sinuses, mucus is able to drain out, and air is able to circulate through them by way of your nose. However, when your paranasal sinuses are inflamed, mucus and air can   become trapped. This can allow bacteria and other germs to grow and cause infection. Sinusitis can develop quickly and last only a short time (acute) or continue over a long period (chronic). Sinusitis that lasts for more than 12 weeks is considered chronic.  CAUSES  Causes of sinusitis include: Allergies. Structural abnormalities, such as displacement of the cartilage that separates your nostrils (deviated septum), which can decrease the air flow through your nose and sinuses and affect sinus  drainage. Functional abnormalities, such as when the small hairs (cilia) that line your sinuses and help remove mucus do not work properly or are not present. SIGNS AND SYMPTOMS  Symptoms of acute and chronic sinusitis are the same. The primary symptoms are pain and pressure around the affected sinuses. Other symptoms include: Upper toothache. Earache. Headache. Bad breath. Decreased sense of smell and taste. A cough, which worsens when you are lying flat. Fatigue. Fever. Thick drainage from your nose, which often is green and may contain pus (purulent). Swelling and warmth over the affected sinuses. DIAGNOSIS  Your health care provider will perform a physical exam. During the exam, your health care provider may: Look in your nose for signs of abnormal growths in your nostrils (nasal polyps).  Tap over the affected sinus to check for signs of infection. View the inside of your sinuses (endoscopy) using an imaging device that has a light attached (endoscope). If your health care provider suspects that you have chronic sinusitis, one or more of the following tests may be recommended: Allergy tests. Nasal culture. A sample of mucus is taken from your nose, sent to a lab, and screened for bacteria. Nasal cytology. A sample of mucus is taken from your nose and examined by your health care provider to determine if your sinusitis is related to an allergy. TREATMENT  Most cases of acute sinusitis are related to a viral infection and will resolve on their own within 10 days. Sometimes medicines are prescribed to help relieve symptoms (pain medicine, decongestants, nasal steroid sprays, or saline sprays).  However, for sinusitis related to a bacterial infection, your health care provider will prescribe antibiotic medicines. These are medicines that will help kill the bacteria causing the infection.  Rarely, sinusitis is caused by a fungal infection. In theses cases, your health care provider will  prescribe antifungal medicine. For some cases of chronic sinusitis, surgery is needed. Generally, these are cases in which sinusitis recurs more than 3 times per year, despite other treatments. HOME CARE INSTRUCTIONS  Drink plenty of water. Water helps thin the mucus so your sinuses can drain more easily. Use a humidifier. Inhale steam 3 to 4 times a day (for example, sit in the bathroom with the shower running). Apply a warm, moist washcloth to your face 3 to 4 times a day, or as directed by your health care provider. Use saline nasal sprays to help moisten and clean your sinuses. Take medicines only as directed by your health care provider. If you were prescribed either an antibiotic or antifungal medicine, finish it all even if you start to feel better. SEEK IMMEDIATE MEDICAL CARE IF: You have increasing pain or severe headaches. You have nausea, vomiting, or drowsiness. You have swelling around your face. You have vision problems. You have a stiff neck. You have difficulty breathing. MAKE SURE YOU:  Understand these instructions. Will watch your condition. Will get help right away if you are not doing well or get worse. Document Released: 10/26/2005 Document Revised: 03/12/2014 Document Reviewed: 11/10/2011 [employer]   Patient Information 2015 [employer]. This information is not intended to replace advice given to you by your health care provider. Make sure you discuss any questions you have with your health care provider.   

## 2015-09-13 ENCOUNTER — Encounter: Payer: Self-pay | Admitting: Internal Medicine

## 2015-09-25 ENCOUNTER — Encounter: Payer: Self-pay | Admitting: Internal Medicine

## 2015-10-08 ENCOUNTER — Encounter: Payer: Self-pay | Admitting: Internal Medicine

## 2015-10-08 ENCOUNTER — Ambulatory Visit (INDEPENDENT_AMBULATORY_CARE_PROVIDER_SITE_OTHER): Payer: 59 | Admitting: Internal Medicine

## 2015-10-08 VITALS — BP 124/72 | HR 74 | Temp 97.8°F | Resp 18 | Ht 62.5 in | Wt 190.0 lb

## 2015-10-08 DIAGNOSIS — J069 Acute upper respiratory infection, unspecified: Secondary | ICD-10-CM

## 2015-10-08 DIAGNOSIS — Z1212 Encounter for screening for malignant neoplasm of rectum: Secondary | ICD-10-CM

## 2015-10-08 DIAGNOSIS — E669 Obesity, unspecified: Secondary | ICD-10-CM

## 2015-10-08 DIAGNOSIS — E785 Hyperlipidemia, unspecified: Secondary | ICD-10-CM

## 2015-10-08 DIAGNOSIS — Z0001 Encounter for general adult medical examination with abnormal findings: Secondary | ICD-10-CM

## 2015-10-08 DIAGNOSIS — Z0000 Encounter for general adult medical examination without abnormal findings: Secondary | ICD-10-CM

## 2015-10-08 DIAGNOSIS — Z130 Encounter for screening for diseases of the blood and blood-forming organs and certain disorders involving the immune mechanism: Secondary | ICD-10-CM

## 2015-10-08 DIAGNOSIS — J0100 Acute maxillary sinusitis, unspecified: Secondary | ICD-10-CM

## 2015-10-08 DIAGNOSIS — R7309 Other abnormal glucose: Secondary | ICD-10-CM

## 2015-10-08 DIAGNOSIS — E559 Vitamin D deficiency, unspecified: Secondary | ICD-10-CM

## 2015-10-08 DIAGNOSIS — Z79899 Other long term (current) drug therapy: Secondary | ICD-10-CM

## 2015-10-08 DIAGNOSIS — R030 Elevated blood-pressure reading, without diagnosis of hypertension: Secondary | ICD-10-CM

## 2015-10-08 LAB — CBC WITH DIFFERENTIAL/PLATELET
BASOS ABS: 0 10*3/uL (ref 0.0–0.1)
BASOS PCT: 0 % (ref 0–1)
EOS ABS: 0.2 10*3/uL (ref 0.0–0.7)
EOS PCT: 3 % (ref 0–5)
HCT: 39.2 % (ref 36.0–46.0)
Hemoglobin: 13 g/dL (ref 12.0–15.0)
LYMPHS PCT: 29 % (ref 12–46)
Lymphs Abs: 1.8 10*3/uL (ref 0.7–4.0)
MCH: 29.1 pg (ref 26.0–34.0)
MCHC: 33.2 g/dL (ref 30.0–36.0)
MCV: 87.9 fL (ref 78.0–100.0)
MPV: 10.9 fL (ref 8.6–12.4)
Monocytes Absolute: 0.9 10*3/uL (ref 0.1–1.0)
Monocytes Relative: 15 % — ABNORMAL HIGH (ref 3–12)
NEUTROS PCT: 53 % (ref 43–77)
Neutro Abs: 3.3 10*3/uL (ref 1.7–7.7)
PLATELETS: 384 10*3/uL (ref 150–400)
RBC: 4.46 MIL/uL (ref 3.87–5.11)
RDW: 14 % (ref 11.5–15.5)
WBC: 6.2 10*3/uL (ref 4.0–10.5)

## 2015-10-08 LAB — LIPID PANEL
CHOL/HDL RATIO: 3.9 ratio (ref <=5.0)
Cholesterol: 239 mg/dL — ABNORMAL HIGH (ref 125–200)
HDL: 61 mg/dL (ref >=46)
LDL CALC: 157 mg/dL — AB (ref <130)
TRIGLYCERIDES: 104 mg/dL (ref <150)
VLDL: 21 mg/dL (ref <30)

## 2015-10-08 LAB — BASIC METABOLIC PANEL WITH GFR
BUN: 23 mg/dL (ref 7–25)
CO2: 30 mmol/L (ref 20–31)
Calcium: 9.1 mg/dL (ref 8.6–10.4)
Chloride: 103 mmol/L (ref 98–110)
Creat: 0.82 mg/dL (ref 0.50–1.05)
GFR, EST NON AFRICAN AMERICAN: 83 mL/min (ref >=60)
Glucose, Bld: 93 mg/dL (ref 65–99)
POTASSIUM: 4.4 mmol/L (ref 3.5–5.3)
SODIUM: 140 mmol/L (ref 135–146)

## 2015-10-08 LAB — IRON AND TIBC
%SAT: 19 % (ref 11–50)
IRON: 92 ug/dL (ref 45–160)
TIBC: 493 ug/dL — ABNORMAL HIGH (ref 250–450)
UIBC: 401 ug/dL — ABNORMAL HIGH (ref 125–400)

## 2015-10-08 LAB — HEPATIC FUNCTION PANEL
ALBUMIN: 4.2 g/dL (ref 3.6–5.1)
ALK PHOS: 89 U/L (ref 33–130)
ALT: 10 U/L (ref 6–29)
AST: 16 U/L (ref 10–35)
BILIRUBIN DIRECT: 0.1 mg/dL (ref <=0.2)
BILIRUBIN INDIRECT: 0.5 mg/dL (ref 0.2–1.2)
BILIRUBIN TOTAL: 0.6 mg/dL (ref 0.2–1.2)
Total Protein: 6.8 g/dL (ref 6.1–8.1)

## 2015-10-08 LAB — TSH: TSH: 1.554 u[IU]/mL (ref 0.350–4.500)

## 2015-10-08 LAB — VITAMIN B12: VITAMIN B 12: 316 pg/mL (ref 211–911)

## 2015-10-08 LAB — HEMOGLOBIN A1C
Hgb A1c MFr Bld: 5.4 % (ref <5.7)
Mean Plasma Glucose: 108 mg/dL (ref <117)

## 2015-10-08 LAB — MAGNESIUM: Magnesium: 2.3 mg/dL (ref 1.5–2.5)

## 2015-10-08 MED ORDER — FLUTICASONE PROPIONATE 50 MCG/ACT NA SUSP: 2.0000 | Freq: Every day | NASAL | Status: DC

## 2015-10-08 NOTE — Patient Instructions (Signed)
Preventive Care for Adults  A healthy lifestyle and preventive care can promote health and wellness. Preventive health guidelines for women include the following key practices.  A routine yearly physical is a good way to check with your health care provider about your health and preventive screening. It is a chance to share any concerns and updates on your health and to receive a thorough exam.  Visit your dentist for a routine exam and preventive care every 6 months. Brush your teeth twice a day and floss once a day. Good oral hygiene prevents tooth decay and gum disease.  The frequency of eye exams is based on your age, health, family medical history, use of contact lenses, and other factors. Follow your health care provider's recommendations for frequency of eye exams.  Eat a healthy diet. Foods like vegetables, fruits, whole grains, low-fat dairy products, and lean protein foods contain the nutrients you need without too many calories. Decrease your intake of foods high in solid fats, added sugars, and salt. Eat the right amount of calories for you.Get information about a proper diet from your health care provider, if necessary.  Regular physical exercise is one of the most important things you can do for your health. Most adults should get at least 150 minutes of moderate-intensity exercise (any activity that increases your heart rate and causes you to sweat) each week. In addition, most adults need muscle-strengthening exercises on 2 or more days a week.  Maintain a healthy weight. The body mass index (BMI) is a screening tool to identify possible weight problems. It provides an estimate of body fat based on height and weight. Your health care provider can find your BMI and can help you achieve or maintain a healthy weight.For adults 20 years and older:  A BMI below 18.5 is considered underweight.  A BMI of 18.5 to 24.9 is normal.  A BMI of 25 to 29.9 is considered overweight.  A BMI  of 30 and above is considered obese.  Maintain normal blood lipids and cholesterol levels by exercising and minimizing your intake of saturated fat. Eat a balanced diet with plenty of fruit and vegetables. Blood tests for lipids and cholesterol should begin at age 58 and be repeated every 5 years. If your lipid or cholesterol levels are high, you are over 50, or you are at high risk for heart disease, you may need your cholesterol levels checked more frequently.Ongoing high lipid and cholesterol levels should be treated with medicines if diet and exercise are not working.  If you smoke, find out from your health care provider how to quit. If you do not use tobacco, do not start.  Lung cancer screening is recommended for adults aged 58-80 years who are at high risk for developing lung cancer because of a history of smoking. A yearly low-dose CT scan of the lungs is recommended for people who have at least a 30-pack-year history of smoking and are a current smoker or have quit within the past 15 years. A pack year of smoking is smoking an average of 1 pack of cigarettes a day for 1 year (for example: 1 pack a day for 30 years or 2 packs a day for 15 years). Yearly screening should continue until the smoker has stopped smoking for at least 15 years. Yearly screening should be stopped for people who develop a health problem that would prevent them from having lung cancer treatment.  High blood pressure causes heart disease and increases the risk of  stroke. Your blood pressure should be checked at least every 1 to 2 years. Ongoing high blood pressure should be treated with medicines if weight loss and exercise do not work.  If you are 55-79 years old, ask your health care provider if you should take aspirin to prevent strokes.  Diabetes screening involves taking a blood sample to check your fasting blood sugar level. This should be done once every 3 years, after age 45, if you are within normal weight and  without risk factors for diabetes. Testing should be considered at a younger age or be carried out more frequently if you are overweight and have at least 1 risk factor for diabetes.  Breast cancer screening is essential preventive care for women. You should practice "breast self-awareness." This means understanding the normal appearance and feel of your breasts and may include breast self-examination. Any changes detected, no matter how small, should be reported to a health care provider. Women in their 20s and 30s should have a clinical breast exam (CBE) by a health care provider as part of a regular health exam every 1 to 3 years. After age 40, women should have a CBE every year. Starting at age 40, women should consider having a mammogram (breast X-ray test) every year. Women who have a family history of breast cancer should talk to their health care provider about genetic screening. Women at a high risk of breast cancer should talk to their health care providers about having an MRI and a mammogram every year.  Breast cancer gene (BRCA)-related cancer risk assessment is recommended for women who have family members with BRCA-related cancers. BRCA-related cancers include breast, ovarian, tubal, and peritoneal cancers. Having family members with these cancers may be associated with an increased risk for harmful changes (mutations) in the breast cancer genes BRCA1 and BRCA2. Results of the assessment will determine the need for genetic counseling and BRCA1 and BRCA2 testing.  Routine pelvic exams to screen for cancer are no longer recommended for nonpregnant women who are considered low risk for cancer of the pelvic organs (ovaries, uterus, and vagina) and who do not have symptoms. Ask your health care provider if a screening pelvic exam is right for you.  If you have had past treatment for cervical cancer or a condition that could lead to cancer, you need Pap tests and screening for cancer for at least 20  years after your treatment. If Pap tests have been discontinued, your risk factors (such as having a new sexual partner) need to be reassessed to determine if screening should be resumed. Some women have medical problems that increase the chance of getting cervical cancer. In these cases, your health care provider may recommend more frequent screening and Pap tests.  Colorectal cancer can be detected and often prevented. Most routine colorectal cancer screening begins at the age of 50 years and continues through age 75 years. However, your health care provider may recommend screening at an earlier age if you have risk factors for colon cancer. On a yearly basis, your health care provider may provide home test kits to check for hidden blood in the stool. Use of a small camera at the end of a tube, to directly examine the colon (sigmoidoscopy or colonoscopy), can detect the earliest forms of colorectal cancer. Talk to your health care provider about this at age 50, when routine screening begins. Direct exam of the colon should be repeated every 5-10 years through age 75 years, unless early forms of pre-cancerous   polyps or small growths are found.  Hepatitis C blood testing is recommended for all people born from 1945 through 1965 and any individual with known risks for hepatitis C.  Pra  Osteoporosis is a disease in which the bones lose minerals and strength with aging. This can result in serious bone fractures or breaks. The risk of osteoporosis can be identified using a bone density scan. Women ages 65 years and over and women at risk for fractures or osteoporosis should discuss screening with their health care providers. Ask your health care provider whether you should take a calcium supplement or vitamin D to reduce the rate of osteoporosis.  Menopause can be associated with physical symptoms and risks. Hormone replacement therapy is available to decrease symptoms and risks. You should talk to your  health care provider about whether hormone replacement therapy is right for you.  Use sunscreen. Apply sunscreen liberally and repeatedly throughout the day. You should seek shade when your shadow is shorter than you. Protect yourself by wearing long sleeves, pants, a wide-brimmed hat, and sunglasses year round, whenever you are outdoors.  Once a month, do a whole body skin exam, using a mirror to look at the skin on your back. Tell your health care provider of new moles, moles that have irregular borders, moles that are larger than a pencil eraser, or moles that have changed in shape or color.  Stay current with required vaccines (immunizations).  Influenza vaccine. All adults should be immunized every year.  Tetanus, diphtheria, and acellular pertussis (Td, Tdap) vaccine. Pregnant women should receive 1 dose of Tdap vaccine during each pregnancy. The dose should be obtained regardless of the length of time since the last dose. Immunization is preferred during the 27th-36th week of gestation. An adult who has not previously received Tdap or who does not know her vaccine status should receive 1 dose of Tdap. This initial dose should be followed by tetanus and diphtheria toxoids (Td) booster doses every 10 years. Adults with an unknown or incomplete history of completing a 3-dose immunization series with Td-containing vaccines should begin or complete a primary immunization series including a Tdap dose. Adults should receive a Td booster every 10 years.  Varicella vaccine. An adult without evidence of immunity to varicella should receive 2 doses or a second dose if she has previously received 1 dose. Pregnant females who do not have evidence of immunity should receive the first dose after pregnancy. This first dose should be obtained before leaving the health care facility. The second dose should be obtained 4-8 weeks after the first dose.  Human papillomavirus (HPV) vaccine. Females aged 13-26 years  who have not received the vaccine previously should obtain the 3-dose series. The vaccine is not recommended for use in pregnant females. However, pregnancy testing is not needed before receiving a dose. If a female is found to be pregnant after receiving a dose, no treatment is needed. In that case, the remaining doses should be delayed until after the pregnancy. Immunization is recommended for any person with an immunocompromised condition through the age of 26 years if she did not get any or all doses earlier. During the 3-dose series, the second dose should be obtained 4-8 weeks after the first dose. The third dose should be obtained 24 weeks after the first dose and 16 weeks after the second dose.  Zoster vaccine. One dose is recommended for adults aged 60 years or older unless certain conditions are present.  Measles, mumps, and rubella (  MMR) vaccine. Adults born before [birth year] generally are considered immune to measles and mumps. Adults born in [birth year] or later should have 1 or more doses of MMR vaccine unless there is a contraindication to the vaccine or there is laboratory evidence of immunity to each of the three diseases. A routine second dose of MMR vaccine should be obtained at least 28 days after the first dose for students attending postsecondary schools, health care workers, or international travelers. People who received inactivated measles vaccine or an unknown type of measles vaccine during 1963-1967 should receive 2 doses of MMR vaccine. People who received inactivated mumps vaccine or an unknown type of mumps vaccine before 1979 and are at high risk for mumps infection should consider immunization with 2 doses of MMR vaccine. For females of childbearing age, rubella immunity should be determined. If there is no evidence of immunity, females who are not pregnant should be vaccinated. If there is no evidence of immunity, females who are pregnant should delay immunization until after pregnancy.  Unvaccinated health care workers born before [birth year] who lack laboratory evidence of measles, mumps, or rubella immunity or laboratory confirmation of disease should consider measles and mumps immunization with 2 doses of MMR vaccine or rubella immunization with 1 dose of MMR vaccine.  Pneumococcal 13-valent conjugate (PCV13) vaccine. When indicated, a person who is uncertain of her immunization history and has no record of immunization should receive the PCV13 vaccine. An adult aged 39 years or older who has certain medical conditions and has not been previously immunized should receive 1 dose of PCV13 vaccine. This PCV13 should be followed with a dose of pneumococcal polysaccharide (PPSV23) vaccine. The PPSV23 vaccine dose should be obtained at least 8 weeks after the dose of PCV13 vaccine. An adult aged 62 years or older who has certain medical conditions and previously received 1 or more doses of PPSV23 vaccine should receive 1 dose of PCV13. The PCV13 vaccine dose should be obtained 1 or more years after the last PPSV23 vaccine dose.    Pneumococcal polysaccharide (PPSV23) vaccine. When PCV13 is also indicated, PCV13 should be obtained first. All adults aged 67 years and older should be immunized. An adult younger than age 45 years who has certain medical conditions should be immunized. Any person who resides in a nursing home or long-term care facility should be immunized. An adult smoker should be immunized. People with an immunocompromised condition and certain other conditions should receive both PCV13 and PPSV23 vaccines. People with human immunodeficiency virus (HIV) infection should be immunized as soon as possible after diagnosis. Immunization during chemotherapy or radiation therapy should be avoided. Routine use of PPSV23 vaccine is not recommended for American Indians, [place] Natives, or people younger than 65 years unless there are medical conditions that require PPSV23 vaccine. When indicated,  people who have unknown immunization and have no record of immunization should receive PPSV23 vaccine. One-time revaccination 5 years after the first dose of PPSV23 is recommended for people aged 19-64 years who have chronic kidney failure, nephrotic syndrome, asplenia, or immunocompromised conditions. People who received 1-2 doses of PPSV23 before age 23 years should receive another dose of PPSV23 vaccine at age 35 years or later if at least 5 years have passed since the previous dose. Doses of PPSV23 are not needed for people immunized with PPSV23 at or after age 38 years.  Preventive Services / Frequency   Ages 43 to 86 years  Blood pressure check.  Lipid and cholesterol check.  Lung  cancer screening. / Every year if you are aged 55-80 years and have a 30-pack-year history of smoking and currently smoke or have quit within the past 15 years. Yearly screening is stopped once you have quit smoking for at least 15 years or develop a health problem that would prevent you from having lung cancer treatment.  Clinical breast exam.** / Every year after age 40 years.  BRCA-related cancer risk assessment.** / For women who have family members with a BRCA-related cancer (breast, ovarian, tubal, or peritoneal cancers).  Mammogram.** / Every year beginning at age 40 years and continuing for as long as you are in good health. Consult with your health care provider.  Pap test.** / Every 3 years starting at age 30 years through age 65 or 70 years with a history of 3 consecutive normal Pap tests.  HPV screening.** / Every 3 years from ages 30 years through ages 65 to 70 years with a history of 3 consecutive normal Pap tests.  Fecal occult blood test (FOBT) of stool. / Every year beginning at age 50 years and continuing until age 75 years. You may not need to do this test if you get a colonoscopy every 10 years.  Flexible sigmoidoscopy or colonoscopy.** / Every 5 years for a flexible sigmoidoscopy or  every 10 years for a colonoscopy beginning at age 50 years and continuing until age 75 years.  Hepatitis C blood test.** / For all people born from 1945 through 1965 and any individual with known risks for hepatitis C.  Skin self-exam. / Monthly.  Influenza vaccine. / Every year.  Tetanus, diphtheria, and acellular pertussis (Tdap/Td) vaccine.** / Consult your health care provider. Pregnant women should receive 1 dose of Tdap vaccine during each pregnancy. 1 dose of Td every 10 years.  Varicella vaccine.** / Consult your health care provider. Pregnant females who do not have evidence of immunity should receive the first dose after pregnancy.  Zoster vaccine.** / 1 dose for adults aged 60 years or older.  Pneumococcal 13-valent conjugate (PCV13) vaccine.** / Consult your health care provider.  Pneumococcal polysaccharide (PPSV23) vaccine.** / 1 to 2 doses if you smoke cigarettes or if you have certain conditions.  Meningococcal vaccine.** / Consult your health care provider.  Hepatitis A vaccine.** / Consult your health care provider.  Hepatitis B vaccine.** / Consult your health care provider. Screening for abdominal aortic aneurysm (AAA)  by ultrasound is recommended for people over 50 who have history of high blood pressure or who are current or former smokers.   

## 2015-10-08 NOTE — Progress Notes (Signed)
Patient ID: Kristie OchsShirley H Klaiber, female   DOB: 1966-03-02, 49 y.o.   MRN: [id number]  Complete Physical  Assessment and Plan:   1. Elevated blood pressure reading without diagnosis of hypertension  - Urinalysis, Routine w reflex microscopic ([facility]) - Microalbumin / creatinine urine ratio - EKG 12-Lead - US, RETROPERITNL ABD,  LTD - TSH  2. Hyperlipidemia  - Lipid panel  3. Other abnormal glucose  - Hemoglobin A1c - Insulin, random  4. Vitamin D deficiency -cont supplement - VITAMIN D 25 Hydroxy (Vit-D Deficiency, Fractures)  5. Obesity   6. Encounter for long-term (current) use of medications  - CBC with Differential/Platelet - BASIC METABOLIC PANEL WITH GFR - Hepatic function panel - Magnesium  7. Encounter for general adult medical examination with abnormal findings   8. Screening for rectal cancer  - POC Hemoccult Bld/Stl (3-Cd Home Screen); Future - Ambulatory referral to Gastroenterology  9. Screening for deficiency anemia  - Iron and TIBC - Vitamin B12  10. Acute maxillary sinusitis, recurrence not specified  - fluticasone (FLONASE) 50 MCG/ACT nasal spray; Place 2 sprays into both nostrils at bedtime.  Dispense: 16 g; Refill: 1  11. Acute URI  - fluticasone (FLONASE) 50 MCG/ACT nasal spray; Place 2 sprays into both nostrils at bedtime.  Dispense: 16 g; Refill: 1    Discussed med's effects and SE's. Screening labs and tests as requested with regular follow-up as recommended.  HPI  49 y.o. female  presents for a complete physical.  Her blood pressure has been controlled at home, today their BP is BP: 124/72 mmHg.  She does not workout. She denies chest pain, shortness of breath, dizziness.   She is not on cholesterol medication and denies myalgias. Her cholesterol is not at goal. The cholesterol last visit was:  Lab Results  Component Value Date   CHOL 230* 12/03/2014   HDL 63 12/03/2014   LDLCALC 133* 12/03/2014   TRIG 169* 12/03/2014   CHOLHDL 3.7 12/03/2014  .  She has been working on diet and exercise for prediabetes, she is not on bASA, she is not on ACE/ARB and denies foot ulcerations, hyperglycemia, hypoglycemia , increased appetite, nausea, paresthesia of the feet, polydipsia, polyuria, visual disturbances, vomiting and weight loss. Last A1C in the office was:  Lab Results  Component Value Date   HGBA1C 5.6 12/03/2014    Patient is on Vitamin D supplement.   Lab Results  Component Value Date   VD25OH 22* 12/03/2014     She reports that she has been seeing Obgyn and she has seen them already this year.  She does report that she is due to have a biopsy on the 8th for endometrial thickening.  She reports that Dr. Jennette KettleNeal told her not to be concerned.    She does report that she has been having a lot of urinary urgency and frequency and sometimes is double voiding. She reports that this has been going on several months.  She has not mentioned it to Dr. Jennette KettleNeal. She reports no urinary leakage that she is aware of.  She does have a lot of urgency or she feels that she will leak.    Current Medications:  Current Outpatient Prescriptions on File Prior to Visit  Medication Sig Dispense Refill  . estradiol (ESTRACE) 2 MG tablet Take 2 mg by mouth 2 (two) times daily.     No current facility-administered medications on file prior to visit.    Health Maintenance:   Immunization History  Administered Date(s) Administered  . Pneumococcal-Unspecified 03/30/1996  . Td 05/17/1993, 07/11/2015    Tetanus: 07/11/15 Pneumovax:03/30/96 Flu vaccine:Declined LMP:  09/08/15 Pap:  October 2016 MGM: October 2016, okay by Dr. Jennette Kettle Colonoscopy:Referral placed Last Dental Exam:  Dr. Marlowe Alt, once yearly  Last Eye Exam: Dr. Christophe Louis, yearly Patient Care Team: Lucky Cowboy, MD as PCP - General (Internal Medicine)  Allergies:  Allergies  Allergen Reactions  . Augmentin [Amoxicillin-Pot Clavulanate]     nausea  . Levaquin  [Levofloxacin In D5w]     Palpitations    Medical History:  Past Medical History  Diagnosis Date  . Hyperlipidemia   . Other abnormal glucose   . Elevated blood pressure reading without diagnosis of hypertension 01/20/2014    Surgical History: No past surgical history on file.  Family History:  Family History  Problem Relation Age of Onset  . Heart attack Father   . Stroke Father     Social History:  Social History  Substance Use Topics  . Smoking status: Never Smoker   . Smokeless tobacco: None  . Alcohol Use: None    Review of Systems: Review of Systems  Constitutional: Negative for fever, chills and malaise/fatigue.  HENT: Positive for congestion. Negative for ear pain and sore throat.   Eyes: Negative.   Respiratory: Positive for cough. Negative for shortness of breath and wheezing.   Cardiovascular: Negative for chest pain, palpitations and leg swelling.  Gastrointestinal: Negative for heartburn, diarrhea, constipation, blood in stool and melena.  Genitourinary: Positive for urgency and frequency. Negative for dysuria and hematuria.  Musculoskeletal: Negative.   Skin: Negative.   Neurological: Negative for dizziness, sensory change, loss of consciousness and headaches.  Psychiatric/Behavioral: Negative for depression. The patient is not nervous/anxious and does not have insomnia.     Physical Exam: Estimated body mass index is 34.18 kg/(m^2) as calculated from the following:   Height as of this encounter: 5' 2.5" (1.588 m).   Weight as of this encounter: 190 lb (86.183 kg). BP 124/72 mmHg  Pulse 74  Temp(Src) 97.8 F (36.6 C) (Temporal)  Resp 18  Ht 5' 2.5" (1.588 m)  Wt 190 lb (86.183 kg)  BMI 34.18 kg/m2  LMP 09/08/2015  General Appearance: Well nourished well developed, in no apparent distress.  Eyes: PERRLA, EOMs, conjunctiva no swelling or erythema ENT/Mouth: Ear canals normal without obstruction, swelling, erythema, or discharge.  TMs normal  bilaterally with no erythema, bulging, retraction, or loss of landmark.  Oropharynx moist and clear with no exudate, erythema, or swelling.   Neck: Supple, thyroid normal. No bruits.  No cervical adenopathy Respiratory: Respiratory effort normal, Breath sounds clear A&P without wheeze, rhonchi, rales.   Cardio: RRR without murmurs, rubs or gallops. Brisk peripheral pulses without edema.  Chest: symmetric, with normal excursions Breasts: Symmetric, without lumps, nipple discharge, retractions.  Abdomen: Soft, nontender, no guarding, rebound, hernias, masses, or organomegaly.  Lymphatics: Non tender without lymphadenopathy.  Genitourinary:  Musculoskeletal: Full ROM all peripheral extremities,5/5 strength, and normal gait.  Skin: Warm, dry without rashes, lesions, ecchymosis.  Multiple scratches to bilateral arms. Small 0.5 mm irregular nevi on the back, slightly darker pigmentation. Neuro: Awake and oriented X 3, Cranial nerves intact, reflexes equal bilaterally. Normal muscle tone, no cerebellar symptoms. Sensation intact.  Psych:  normal affect, Insight and Judgment appropriate.   EKG: WNL no changes.  AORTA SCAN: WNL   Over 40 minutes of exam, counseling, chart review and critical decision making was performed  Terri Piedra 9:29 AM  [facility]

## 2015-10-09 LAB — URINALYSIS, MICROSCOPIC ONLY
BACTERIA UA: NONE SEEN [HPF]
Casts: NONE SEEN [LPF]
YEAST: NONE SEEN [HPF]

## 2015-10-09 LAB — MICROALBUMIN / CREATININE URINE RATIO
Creatinine, Urine: 236 mg/dL (ref 20–320)
MICROALB/CREAT RATIO: 8 ug/mg{creat} (ref <30)
Microalb, Ur: 1.8 mg/dL

## 2015-10-09 LAB — URINALYSIS, ROUTINE W REFLEX MICROSCOPIC
BILIRUBIN URINE: NEGATIVE
GLUCOSE, UA: NEGATIVE
Ketones, ur: NEGATIVE
Nitrite: NEGATIVE
PROTEIN: NEGATIVE
Specific Gravity, Urine: 1.028 (ref 1.001–1.035)
pH: 6 (ref 5.0–8.0)

## 2015-10-09 LAB — INSULIN, RANDOM: Insulin: 5.9 u[IU]/mL (ref 2.0–19.6)

## 2015-10-09 LAB — VITAMIN D 25 HYDROXY (VIT D DEFICIENCY, FRACTURES): Vit D, 25-Hydroxy: 20 ng/mL — ABNORMAL LOW (ref 30–100)

## 2016-03-03 ENCOUNTER — Encounter: Payer: Self-pay | Admitting: Physician Assistant

## 2016-04-08 ENCOUNTER — Ambulatory Visit: Payer: Self-pay | Admitting: Internal Medicine

## 2016-10-07 ENCOUNTER — Encounter: Payer: Self-pay | Admitting: Internal Medicine

## 2016-10-16 ENCOUNTER — Encounter (INDEPENDENT_AMBULATORY_CARE_PROVIDER_SITE_OTHER): Payer: Self-pay | Admitting: Orthopedic Surgery

## 2016-10-16 ENCOUNTER — Ambulatory Visit (INDEPENDENT_AMBULATORY_CARE_PROVIDER_SITE_OTHER): Payer: 59 | Admitting: Family

## 2016-10-16 ENCOUNTER — Ambulatory Visit (INDEPENDENT_AMBULATORY_CARE_PROVIDER_SITE_OTHER): Payer: 59

## 2016-10-16 VITALS — Ht 63.0 in | Wt 150.0 lb

## 2016-10-16 DIAGNOSIS — M79642 Pain in left hand: Secondary | ICD-10-CM | POA: Diagnosis not present

## 2016-10-16 DIAGNOSIS — S62327A Displaced fracture of shaft of fifth metacarpal bone, left hand, initial encounter for closed fracture: Secondary | ICD-10-CM | POA: Diagnosis not present

## 2016-10-16 MED ORDER — HYDROCODONE-ACETAMINOPHEN 5-325 MG PO TABS: 1.0000 | ORAL_TABLET | Freq: Three times a day (TID) | ORAL | 0 refills | Status: DC | PRN

## 2016-10-16 NOTE — Progress Notes (Signed)
Office Visit Note   Patient: Kristie Burke           Date of Birth: 1966/01/05           MRN: [id number] Visit Date: 10/16/2016              Requested by: [name], MD [address] [place], [place] [address] PCP: Nadean CorwinMCKEOWN,WILLIAM DAVID, MD   Assessment & Plan: Visit Diagnoses:  1. Closed displaced fracture of shaft of fifth metacarpal bone of left hand, initial encounter   2. Pain in left hand     Plan: Continue velcro splint. May remove to bathe and shower. Elevate and use ice. Have provided a one time prescription for Norco. Will follow up in office in 3 weeks with repeat radiographs of the left hand.   Follow-Up Instructions: Return in about 3 weeks (around 11/06/2016).   Orders:  Orders Placed This Encounter  Procedures  . XR Hand Complete Left   Meds ordered this encounter  Medications  . HYDROcodone-acetaminophen (NORCO/VICODIN) 5-325 MG tablet    Sig: Take 1 tablet by mouth 3 (three) times daily as needed for moderate pain.    Dispense:  30 tablet    Refill:  0      Procedures: No procedures performed   Clinical Data: No additional findings.   Subjective: Chief Complaint  Patient presents with  . Left Hand - Injury    Patient is a 50 year old woman seen today for initial evaluation of right hand pain. States last Friday, 1 week ago today, she was playing with her dog that weighs approximately 70lbs was playing and hit her little finger out laterally. She states she was initially seen at [facility]. She was told she had a spiral fracture, was told to elevate, ice, and was braced in velcro thumb spica splint. She does not have xrays with her today.   Has been taking ibuprofen with minimal relief.     Review of Systems  Constitutional: Negative for chills and fever.  Musculoskeletal: Positive for arthralgias.     Objective: Vital Signs: Ht 5\' 3"  (1.6 m)   Wt 150 lb (68 kg)   BMI 26.57 kg/m   Physical Exam    Constitutional: She is oriented to person, place, and time. She appears well-developed and well-nourished.  Pulmonary/Chest: Effort normal.  Neurological: She is alert and oriented to person, place, and time.  Psychiatric: She has a normal mood and affect.  Nursing note reviewed.   Left Hand Exam   Tenderness  The patient is experiencing tenderness in the dorsal area.   Range of Motion   Wrist  Extension: normal  Flexion: normal  Pronation: normal  Supination: normal   Other  Pulse: present  Comments:  Tenderness, swelling and ecchymosis along the 5th metacarpal      Specialty Comments:  No specialty comments available.  Imaging: No results found.   PMFS History: Patient Active Problem List   Diagnosis Date Noted  . Prediabetes 12/03/2014  . Obesity 08/03/2014  . Vitamin D deficiency 08/03/2014  . Encounter for long-term (current) use of medications 08/03/2014  . Elevated blood pressure reading without diagnosis of hypertension 01/20/2014  . Other abnormal glucose   . Hyperlipidemia    Past Medical History:  Diagnosis Date  . Elevated blood pressure reading without diagnosis of hypertension 01/20/2014  . Hyperlipidemia   . Other abnormal glucose     Family History  Problem Relation Age of  Onset  . Heart attack Father   . Stroke Father     History reviewed. No pertinent surgical history. Social History   Occupational History  . Not on file.   Social History Main Topics  . Smoking status: Never Smoker  . Smokeless tobacco: Not on file  . Alcohol use Not on file  . Drug use: Unknown  . Sexual activity: Not on file

## 2016-11-06 ENCOUNTER — Ambulatory Visit (INDEPENDENT_AMBULATORY_CARE_PROVIDER_SITE_OTHER): Payer: 59

## 2016-11-06 ENCOUNTER — Ambulatory Visit (INDEPENDENT_AMBULATORY_CARE_PROVIDER_SITE_OTHER): Payer: 59 | Admitting: Orthopedic Surgery

## 2016-11-06 VITALS — Ht 63.0 in | Wt 150.0 lb

## 2016-11-06 DIAGNOSIS — M79642 Pain in left hand: Secondary | ICD-10-CM

## 2016-11-06 DIAGNOSIS — S62327D Displaced fracture of shaft of fifth metacarpal bone, left hand, subsequent encounter for fracture with routine healing: Secondary | ICD-10-CM | POA: Insufficient documentation

## 2016-11-06 NOTE — Progress Notes (Signed)
   Office Visit Note   Patient: Kristie OchsShirley H Hertenstein           Date of Birth: 1965/11/28           MRN: [id number] Visit Date: 11/06/2016              Requested by: [name], MD [address] [place], [place] [address] PCP: Nadean CorwinMCKEOWN,WILLIAM DAVID, MD   Assessment & Plan: Visit Diagnoses:  1. Pain in left hand   2. Closed displaced fracture of shaft of fifth metacarpal bone of left hand with routine healing     Plan: Discontinue the Velcro splint work on range of motion of her fingers.  Follow-Up Instructions: Return if symptoms worsen or fail to improve.   Orders:  Orders Placed This Encounter  Procedures  . XR Hand Complete Left   No orders of the defined types were placed in this encounter.     Procedures: No procedures performed   Clinical Data: No additional findings.   Subjective: Chief Complaint  Patient presents with  . Left Hand - Follow-up    Closed displaced fracture of shaft of fifth metacarpal bone of left hand      Closed displaced fracture of shaft of fifth metacarpal bone of left hand, in a velcro splint. Patient states that she is feeling better that she does not have any questions or concerns today.      Review of Systems   Objective: Vital Signs: Ht 5\' 3"  (1.6 m)   Wt 150 lb (68 kg)   BMI 26.57 kg/m   Physical Exam Examination patient is alert oriented no adenopathy well-dressed normal affect normal restrictor effort she has normal gait. Examination she has full range of motion of her fingers no rotational deformity. The fracture site is nontender to palpation there is no varus valgus or flexion extension malalignment. Ortho Exam  Specialty Comments:  No specialty comments available.  Imaging: Xr Hand Complete Left  Result Date: 11/06/2016 Three-view radiographs of the left hand shows stable alignment of the fifth metacarpal fracture in both AP lateral and oblique planes.    PMFS History: Patient Active  Problem List   Diagnosis Date Noted  . Closed displaced fracture of shaft of fifth metacarpal bone of left hand with routine healing 11/06/2016  . Prediabetes 12/03/2014  . Obesity 08/03/2014  . Vitamin D deficiency 08/03/2014  . Encounter for long-term (current) use of medications 08/03/2014  . Elevated blood pressure reading without diagnosis of hypertension 01/20/2014  . Other abnormal glucose   . Hyperlipidemia    Past Medical History:  Diagnosis Date  . Elevated blood pressure reading without diagnosis of hypertension 01/20/2014  . Hyperlipidemia   . Other abnormal glucose     Family History  Problem Relation Age of Onset  . Heart attack Father   . Stroke Father     No past surgical history on file. Social History   Occupational History  . Not on file.   Social History Main Topics  . Smoking status: Never Smoker  . Smokeless tobacco: Not on file  . Alcohol use Not on file  . Drug use: Unknown  . Sexual activity: Not on file

## 2017-03-24 ENCOUNTER — Encounter: Payer: Self-pay | Admitting: *Deleted

## 2017-04-07 ENCOUNTER — Encounter: Payer: Self-pay | Admitting: *Deleted

## 2017-09-28 ENCOUNTER — Encounter: Payer: Self-pay | Admitting: Internal Medicine

## 2017-09-28 ENCOUNTER — Ambulatory Visit: Payer: 59 | Admitting: Internal Medicine

## 2017-09-28 VITALS — BP 140/78 | HR 84 | Temp 97.5°F | Resp 18 | Ht 63.0 in | Wt 177.8 lb

## 2017-09-28 DIAGNOSIS — J0140 Acute pansinusitis, unspecified: Secondary | ICD-10-CM | POA: Diagnosis not present

## 2017-09-28 DIAGNOSIS — J0410 Acute tracheitis without obstruction: Secondary | ICD-10-CM | POA: Diagnosis not present

## 2017-09-28 DIAGNOSIS — J029 Acute pharyngitis, unspecified: Secondary | ICD-10-CM

## 2017-09-28 DIAGNOSIS — H66003 Acute suppurative otitis media without spontaneous rupture of ear drum, bilateral: Secondary | ICD-10-CM | POA: Diagnosis not present

## 2017-09-28 MED ORDER — PREDNISONE 20 MG PO TABS: ORAL_TABLET | ORAL | 0 refills | Status: DC

## 2017-09-28 MED ORDER — AZITHROMYCIN 250 MG PO TABS: ORAL_TABLET | ORAL | 1 refills | Status: DC

## 2017-09-28 MED ORDER — PROMETHAZINE-DM 6.25-15 MG/5ML PO SYRP: ORAL_SOLUTION | ORAL | 1 refills | Status: DC

## 2017-09-28 NOTE — Progress Notes (Signed)
  Subjective:    Patient ID: Kristie Burke, female    DOB: 1966-09-14, 51 y.o.   MRN: [id number]  HPI  Patient is a nice 51 yo MWF last seen Nov 2016 for a screening CPE who presents now w/10 day prodrome of worsening head/chest congestion, sinus pressure, earaches, sl productive cough and S/T. Denies fevers, chills, dyspnea, rash or GI sx's.   Medication Sig  . VITAMIN D Take 5,000 Units daily by mouth.  . Magnesium  500 MG TABS Take 1 tablet daily by mouth.  . vitamin B-12 100 MCG tab Take 100 mcg daily by mouth.    Allergies  Allergen Reactions  . Augmentin [Amoxicillin-Pot Clavulanate]     nausea  . Levaquin [Levofloxacin In D5w]     Palpitations   Past Medical History:  Diagnosis Date  . Elevated blood pressure reading without diagnosis of hypertension 01/20/2014  . Hyperlipidemia   . Other abnormal glucose    Review of Systems  10 point systems review negative except as above.     Objective:   Physical Exam  BP 140/78   Pulse 84   Temp (!) 97.5 F (36.4 C)   Resp 18   Ht 5\' 3"  (1.6 m)   Wt 177 lb 12.8 oz (80.6 kg)   BMI 31.50 kg/m   Dry cough. Hoarse. No stridor. No Rash, cyanosis.  HEENT - Eac's patent. TM's 2+ retracted and pink.  EOM's full. Bilat fronto-max tenderness.  NasoOroPharynx 2+ injected w/o exudate. Neck - supple. Nl Thyroid. Carotids 2+ & No bruits, nodes, JVD Chest - Few scattered dry  Rales, No rhonchi, wheezes. Cor - Nl HS. RRR w/o sig M. MS- FROM. Muscle power, tone and bulk Nl. Gait Nl. Neuro - No obvious Cr N abnormalities.  Nl w/o focal abnormalities.    Assessment & Plan:   1. Tracheitis  2. Pharyngitis  3. Acute  otitis media of both ears  4. Acute   pansinusitis  - predniSONE  20 MG tablet; 1 tab 3 x day for 3 days, then 1 tab 2 x day for 3 days, then 1 tab 1 x day for 5 days  Dispense: 20 tablet; Refill: 0  - azithromycin 250 MG tablet; Take 2 tablets (500 mg) on  Day 1,  followed by 1 tablet (250 mg) once daily on Days 2  through 5.  Dispense: 6 each; Refill: 1  - promethazine-dextromethorphan  6.25-15 MG/5ML syrup; Take 1 to 2 tsp enery 4 hours if needed for cough  Dispense: 360 mL; Refill: 1  - discussed meds/SE's

## 2017-10-07 ENCOUNTER — Encounter: Payer: Self-pay | Admitting: Internal Medicine

## 2017-10-27 ENCOUNTER — Other Ambulatory Visit: Payer: Self-pay | Admitting: Internal Medicine

## 2017-10-27 ENCOUNTER — Ambulatory Visit: Payer: 59 | Admitting: Internal Medicine

## 2017-10-27 VITALS — BP 130/90 | HR 84 | Temp 97.0°F | Resp 18 | Ht 63.0 in | Wt 187.2 lb

## 2017-10-27 DIAGNOSIS — J0140 Acute pansinusitis, unspecified: Secondary | ICD-10-CM | POA: Diagnosis not present

## 2017-10-27 DIAGNOSIS — J042 Acute laryngotracheitis: Secondary | ICD-10-CM

## 2017-10-27 DIAGNOSIS — J029 Acute pharyngitis, unspecified: Secondary | ICD-10-CM | POA: Diagnosis not present

## 2017-10-27 MED ORDER — PROMETHAZINE-DM 6.25-15 MG/5ML PO SYRP: ORAL_SOLUTION | ORAL | 1 refills | Status: DC

## 2017-10-27 MED ORDER — DEXAMETHASONE 1 MG PO TABS: ORAL_TABLET | ORAL | 0 refills | Status: DC

## 2017-10-27 MED ORDER — DOXYCYCLINE HYCLATE 100 MG PO CAPS: ORAL_CAPSULE | ORAL | 0 refills | Status: DC

## 2017-10-31 NOTE — Progress Notes (Signed)
  Subjective:    Patient ID: Kristie Burke, female    DOB: 05/20/1966, 51 y.o.   MRN: [id number]  HPI  Patient is a very nice 51 yo MWF with hx/o elev BP, HLD, preDm, Vit D Def who presents with ongoing head and chest congestion not fully recovering over the last month despite # 2 Z-paks. She reports Head & Chest congestion with yellow-green nasal secretions & sputum. No fever, chills/sweats, rash or dyspnea.   Outpatient Medications Prior to Visit  Medication Sig Dispense Refill  . Cholecalciferol (VITAMIN D PO) Take 5,000 Units daily by mouth.    . Magnesium Oxide 500 MG TABS Take 1 tablet daily by mouth.    . vitamin B-12 (CYANOCOBALAMIN) 100 MCG tablet Take 100 mcg daily by mouth.    . promethazine-dextromethorphan (PROMETHAZINE-DM) 6.25-15 MG/5ML syrup Take 1 to 2 tsp enery 4 hours if needed for cough 360 mL 1  . azithromycin (ZITHROMAX) 250 MG tablet Take 2 tablets (500 mg) on  Day 1,  followed by 1 tablet (250 mg) once daily on Days 2 through 5. 6 each 1  . predniSONE (DELTASONE) 20 MG tablet 1 tab 3 x day for 3 days, then 1 tab 2 x day for 3 days, then 1 tab 1 x day for 5 days 20 tablet 0   No facility-administered medications prior to visit.    Allergies  Allergen Reactions  . Augmentin [Amoxicillin-Pot Clavulanate]     nausea  . Levaquin [Levofloxacin In D5w]     Palpitations   Review of Systems  10 point systems review negative except as above.    Objective:   Physical Exam  BP 130/90   Pulse 84   Temp (!) 97 F (36.1 C)   Resp 18   Ht 5\' 3"  (1.6 m)   Wt 187 lb 3.2 oz (84.9 kg)   BMI 33.16 kg/m   In No Distress. (+) Congested cough.  HEENT - Eac's patent. TM's Nl. EOM's full. (+) Tender frontomaxillary areas.  PERRLA. NasoOroPharynx clear. Neck - supple. Nl Thyroid. Carotids 2+ & No bruits, nodes, JVD Chest -  Scattered bilat. Rales, coarse  Rhonchi and no wheezes. Cor - Nl HS. RRR w/o sig MGR. MS- FROM w/o deformities. Gait Nl. Neuro - No obvious Cr N  abnormalities.  Nl w/o focal abnormalities. Skin - exposed clear w/o rash cyanosis or icterus.   Assessment & Plan:   1. Laryngotracheitis   2. Pharyngitis, unspecified etiology   3. Subacute pansinusitis  - doxycycline (VIBRAMYCIN) 100 MG capsule; Take 1 capsule 2 x / day with food for 10 days, then 1 daily with food for infection  Dispense: 30 capsule; Refill: 0  - dexamethasone (DECADRON) 1 MG tablet; Take 1 tab 3 x day - 3 days, then 2 x day - 3 days, then 1 tab daily x 1 week, then 1 every other day  Dispense: 30 tablet; Refill: 0  - promethazine-dextromethorphan (PROMETHAZINE-DM) 6.25-15 MG/5ML syrup; Take 1 to 2 tsp enery 4 hours if needed for cough  Dispense: 360 mL; Refill: 1

## 2017-11-01 ENCOUNTER — Encounter: Payer: Self-pay | Admitting: Internal Medicine

## 2017-11-15 NOTE — Progress Notes (Deleted)
Complete Physical  Assessment and Plan:  Diagnoses and all orders for this visit:  Encounter for routine adult physical exam with abnormal findings  Closed displaced fracture of shaft of fifth metacarpal bone of left hand with routine healing  Other abnormal glucose       At goal at most recent check Discussed disease and risks Discussed diet/exercise, weight management  -     Hemoglobin A1c  Hyperlipidemia, unspecified hyperlipidemia type Currently treated by lifestyle only and above goal; discussed statin today Continue low cholesterol diet and exercise.  -     Lipid panel -     TSH  Elevated blood pressure reading without diagnosis of hypertension At goal without medication at this time; Continue to monitor Monitor blood pressure at home; call if consistently over 130/80 Continue DASH diet.   Reminder to go to the ER if any CP, SOB, nausea, dizziness, severe HA, changes vision/speech, left arm numbness and tingling and jaw pain.  Class 1 obesity due to excess calories with serious comorbidity and body mass index (BMI) of 33.0 to 33.9 in adult Long discussion about weight loss, diet, and exercise Recommended diet heavy in fruits and veggies and low in animal meats, cheeses, and dairy products, appropriate calorie intake Patient will work on *** Discussed appropriate weight for height (below***) and initial goal (***) Follow up 3 months  Vitamin D deficiency Recommended supplementation (2000 U) for goal of 70-100 -     VITAMIN D 25 Hydroxy (Vit-D Deficiency, Fractures)  Encounter for long-term (current) use of medications -     CBC with Differential/Platelet -     BASIC METABOLIC PANEL WITH GFR -     Hepatic function panel -     Urinalysis w microscopic + reflex cultur  Screening for cardiovascular condition -     EKG 12-Lead  Vitamin B12 deficiency -     Vitamin B12  Screening for hematuria or proteinuria -     Urinalysis w microscopic + reflex  cultur   Discussed med's effects and SE's. Screening labs and tests as requested with regular follow-up as recommended. Over 40 minutes of exam, counseling, chart review, and complex, high level critical decision making was performed this visit.   Future Appointments  Date Time Provider Department Center  11/16/2017 10:00 AM Judd Gaudier, NP [facility] None    HPI  52 y.o. female  presents for a complete physical and follow up for has Other abnormal glucose; Hyperlipidemia; Elevated blood pressure reading without diagnosis of hypertension; Obesity; Vitamin D deficiency; Encounter for long-term (current) use of medications; and Closed displaced fracture of shaft of fifth metacarpal bone of left hand with routine healing on their problem list.   BMI is There is no height or weight on file to calculate BMI., she {HAS HAS ZOX:09604} been working on diet and exercise. Wt Readings from Last 3 Encounters:  10/27/17 187 lb 3.2 oz (84.9 kg)  09/28/17 177 lb 12.8 oz (80.6 kg)  11/06/16 150 lb (68 kg)   Her blood pressure {HAS HAS NOT:18834} been controlled at home, today their BP is   She {DOES_DOES VWU:98119} workout. She denies chest pain, shortness of breath, dizziness.   She is not on cholesterol medication and denies myalgias. Her cholesterol is not at goal. The cholesterol last visit was:   Lab Results  Component Value Date   CHOL 239 (H) 10/08/2015   HDL 61 10/08/2015   LDLCALC 157 (H) 10/08/2015   TRIG 104 10/08/2015   CHOLHDL  3.9 10/08/2015   She {Has/has not:18111} been working on diet and exercise for history of abnormal glucose recently well controlled; she is not on bASA, she is not on ACE/ARB and denies {Symptoms; diabetes w/o none:19199}. Last A1C in the office was:  Lab Results  Component Value Date   HGBA1C 5.4 10/08/2015   Last GFR: Lab Results  Component Value Date   GFRNONAA 83 10/08/2015   Patient is on Vitamin D supplement and is below goal at the last check:     Lab Results  Component Value Date   VD25OH 20 (L) 10/08/2015      Current Medications:  Current Outpatient Medications on File Prior to Visit  Medication Sig Dispense Refill  . Cholecalciferol (VITAMIN D PO) Take 5,000 Units daily by mouth.    . dexamethasone (DECADRON) 1 MG tablet Take 1 tab 3 x day - 3 days, then 2 x day - 3 days, then 1 tab daily x 1 week, then 1 every other day 30 tablet 0  . doxycycline (VIBRAMYCIN) 100 MG capsule Take 1 capsule 2 x / day with food for 10 days, then 1 daily with food for infection 30 capsule 0  . Magnesium Oxide 500 MG TABS Take 1 tablet daily by mouth.    . promethazine-dextromethorphan (PROMETHAZINE-DM) 6.25-15 MG/5ML syrup Take 1 to 2 tsp enery 4 hours if needed for cough 360 mL 1  . vitamin B-12 (CYANOCOBALAMIN) 100 MCG tablet Take 100 mcg daily by mouth.     No current facility-administered medications on file prior to visit.    Allergies:  Allergies  Allergen Reactions  . Augmentin [Amoxicillin-Pot Clavulanate]     nausea  . Levaquin [Levofloxacin In D5w]     Palpitations   Medical History:  She has Other abnormal glucose; Hyperlipidemia; Elevated blood pressure reading without diagnosis of hypertension; Obesity; Vitamin D deficiency; Encounter for long-term (current) use of medications; and Closed displaced fracture of shaft of fifth metacarpal bone of left hand with routine healing on their problem list. Health Maintenance:   Immunization History  Administered Date(s) Administered  . Pneumococcal-Unspecified 03/30/1996  . Td 05/17/1993, 07/11/2015   Tetanus: 07/11/15 Pneumovax:03/30/96 Flu vaccine:Declined Shingrix:   LMP:  No LMP recorded. Pap:  October 2016 MGM: October 2016, okay by Dr. Jennette Kettle *** need results Colonoscopy: Referral placed? Not completed? Cologuard?  Last Dental Exam:  Dr. Marlowe Alt, once yearly  Last Eye Exam: Dr. Christophe Louis, yearly  Patient Care Team: Lucky Cowboy, MD as PCP - General (Internal  Medicine)  Surgical History:  She has no past surgical history on file. Family History:  Herfamily history includes Heart attack in her father; Stroke in her father. Social History:  She reports that  has never smoked. she has never used smokeless tobacco.  Review of Systems: ROS  Physical Exam: Estimated body mass index is 33.16 kg/m as calculated from the following:   Height as of 10/27/17: 5\' 3"  (1.6 m).   Weight as of 10/27/17: 187 lb 3.2 oz (84.9 kg). There were no vitals taken for this visit. General Appearance: Well nourished, in no apparent distress.  Eyes: PERRLA, EOMs, conjunctiva no swelling or erythema, normal fundi and vessels.  Sinuses: No Frontal/maxillary tenderness  ENT/Mouth: Ext aud canals clear, normal light reflex with TMs without erythema, bulging. Good dentition. No erythema, swelling, or exudate on post pharynx. Tonsils not swollen or erythematous. Hearing normal.  Neck: Supple, thyroid normal. No bruits  Respiratory: Respiratory effort normal, BS equal bilaterally without rales, rhonchi,  wheezing or stridor.  Cardio: RRR without murmurs, rubs or gallops. Brisk peripheral pulses without edema.  Chest: symmetric, with normal excursions and percussion.  Breasts: Symmetric, without lumps, nipple discharge, retractions.  Abdomen: Soft, nontender, no guarding, rebound, hernias, masses, or organomegaly.  Lymphatics: Non tender without lymphadenopathy.  Genitourinary:  Musculoskeletal: Full ROM all peripheral extremities,5/5 strength, and normal gait.  Skin: Warm, dry without rashes, lesions, ecchymosis. Neuro: Cranial nerves intact, reflexes equal bilaterally. Normal muscle tone, no cerebellar symptoms. Sensation intact.  Psych: Awake and oriented X 3, normal affect, Insight and Judgment appropriate.   EKG: WNL no ST changes.  Carlyon ShadowAshley C Osie Amparo 8:21 AM [facility]

## 2017-11-16 ENCOUNTER — Encounter: Payer: Self-pay | Admitting: Adult Health

## 2017-12-02 NOTE — Progress Notes (Deleted)
Complete Physical  Assessment and Plan:  Diagnoses and all orders for this visit:  Encounter for annual physical exam  Other abnormal glucose -     Hemoglobin A1c  Hyperlipidemia, unspecified hyperlipidemia type -     Lipid panel -     TSH  Elevated blood pressure reading without diagnosis of hypertension  Class 1 obesity due to excess calories with serious comorbidity and body mass index (BMI) of 33.0 to 33.9 in adult  Vitamin D deficiency -     VITAMIN D 25 Hydroxy (Vit-D Deficiency, Fractures)  Screening for cardiovascular condition -     EKG 12-Lead  Screening for hematuria or proteinuria -     Urinalysis w microscopic + reflex cultur  Vitamin B 12 deficiency -     Vitamin B12  Medication management -     CBC with Differential/Platelet -     BASIC METABOLIC PANEL WITH GFR -     Hepatic function panel   Discussed med's effects and SE's. Screening labs and tests as requested with regular follow-up as recommended. Over 40 minutes of exam, counseling, chart review, and complex, high level critical decision making was performed this visit.   Future Appointments  Date Time Provider Department Center  12/03/2017  9:30 AM Judd Gaudier, NP [facility] None  12/09/2018  9:30 AM Judd Gaudier, NP [facility] None     HPI  52 y.o. female  presents for a complete physical and follow up for has Other abnormal glucose; Hyperlipidemia; Elevated blood pressure reading without diagnosis of hypertension; Obesity; Vitamin D deficiency; and Medication management on their problem list.   BMI is There is no height or weight on file to calculate BMI., she {HAS HAS BJY:78295} been working on diet and exercise. Wt Readings from Last 3 Encounters:  10/27/17 187 lb 3.2 oz (84.9 kg)  09/28/17 177 lb 12.8 oz (80.6 kg)  11/06/16 150 lb (68 kg)   Her blood pressure {HAS HAS NOT:18834} been controlled at home, today their BP is   She {DOES_DOES AOZ:30865} workout. She denies chest pain,  shortness of breath, dizziness.   She is not on cholesterol medication and denies myalgias. Her cholesterol is not at goal. The cholesterol last visit was:   Lab Results  Component Value Date   CHOL 239 (H) 10/08/2015   HDL 61 10/08/2015   LDLCALC 157 (H) 10/08/2015   TRIG 104 10/08/2015   CHOLHDL 3.9 10/08/2015    She {Has/has not:18111} been working on diet and exercise for prediabetes (recent A1Cs well controlled), she {ACTION; IS/IS NOT:21021397} on bASA, she {ACTION; IS/IS NOT:21021397} on ACE/ARB and denies {Symptoms; diabetes w/o none:19199}. Last A1C in the office was:  Lab Results  Component Value Date   HGBA1C 5.4 10/08/2015   Last GFR: Lab Results  Component Value Date   GFRNONAA 83 10/08/2015   Patient is on Vitamin D supplement since last check which was very low *** :   Lab Results  Component Value Date   VD25OH 20 (L) 10/08/2015      Current Medications:  Current Outpatient Medications on File Prior to Visit  Medication Sig Dispense Refill  . Cholecalciferol (VITAMIN D PO) Take 5,000 Units daily by mouth.    . dexamethasone (DECADRON) 1 MG tablet Take 1 tab 3 x day - 3 days, then 2 x day - 3 days, then 1 tab daily x 1 week, then 1 every other day 30 tablet 0  . doxycycline (VIBRAMYCIN) 100 MG capsule Take 1 capsule  2 x / day with food for 10 days, then 1 daily with food for infection 30 capsule 0  . Magnesium Oxide 500 MG TABS Take 1 tablet daily by mouth.    . promethazine-dextromethorphan (PROMETHAZINE-DM) 6.25-15 MG/5ML syrup Take 1 to 2 tsp enery 4 hours if needed for cough 360 mL 1  . vitamin B-12 (CYANOCOBALAMIN) 100 MCG tablet Take 100 mcg daily by mouth.     No current facility-administered medications on file prior to visit.    Allergies:  Allergies  Allergen Reactions  . Augmentin [Amoxicillin-Pot Clavulanate]     nausea  . Levaquin [Levofloxacin In D5w]     Palpitations   Medical History:  She has Other abnormal glucose; Hyperlipidemia;  Elevated blood pressure reading without diagnosis of hypertension; Obesity; Vitamin D deficiency; and Medication management on their problem list. Health Maintenance:   Immunization History  Administered Date(s) Administered  . Pneumococcal-Unspecified 03/30/1996  . Td 05/17/1993, 07/11/2015   Tetanus: 2016 Pneumovax:1997  Prevnar: n/a Flu vaccine: Declined Shingrix:   LMP:  09/08/15 Pap:  October 2016 MGM: October 2016, okay by Dr. Jennette KettleNeal - no reports, request sent  DEXA: n/a Colonoscopy: Referral placed last year*** EGD: n/a   Last Dental Exam:  Dr. Marlowe Alteveny, once yearly  Last Eye Exam: Dr. Christophe LouisByrnes, yearly  Patient Care Team: Lucky CowboyMcKeown, William, MD as PCP - General (Internal Medicine)  Surgical History:  She has no past surgical history on file. Family History:  Herfamily history includes Heart attack in her father; Stroke in her father. Social History:  She reports that  has never smoked. she has never used smokeless tobacco.  Review of Systems: ROS  Physical Exam: Estimated body mass index is 33.16 kg/m as calculated from the following:   Height as of 10/27/17: 5\' 3"  (1.6 m).   Weight as of 10/27/17: 187 lb 3.2 oz (84.9 kg). There were no vitals taken for this visit. General Appearance: Well nourished, in no apparent distress.  Eyes: PERRLA, EOMs, conjunctiva no swelling or erythema, normal fundi and vessels.  Sinuses: No Frontal/maxillary tenderness  ENT/Mouth: Ext aud canals clear, normal light reflex with TMs without erythema, bulging. Good dentition. No erythema, swelling, or exudate on post pharynx. Tonsils not swollen or erythematous. Hearing normal.  Neck: Supple, thyroid normal. No bruits  Respiratory: Respiratory effort normal, BS equal bilaterally without rales, rhonchi, wheezing or stridor.  Cardio: RRR without murmurs, rubs or gallops. Brisk peripheral pulses without edema.  Chest: symmetric, with normal excursions and percussion.  Breasts: Symmetric,  without lumps, nipple discharge, retractions.  Abdomen: Soft, nontender, no guarding, rebound, hernias, masses, or organomegaly.  Lymphatics: Non tender without lymphadenopathy.  Genitourinary:  Musculoskeletal: Full ROM all peripheral extremities,5/5 strength, and normal gait.  Skin: Warm, dry without rashes, lesions, ecchymosis. Neuro: Cranial nerves intact, reflexes equal bilaterally. Normal muscle tone, no cerebellar symptoms. Sensation intact.  Psych: Awake and oriented X 3, normal affect, Insight and Judgment appropriate.   EKG: WNL no ST changes.  Carlyon ShadowAshley C Namon Villarin 1:25 PM  [facility]

## 2017-12-03 ENCOUNTER — Encounter: Payer: Self-pay | Admitting: Adult Health

## 2018-11-17 ENCOUNTER — Encounter: Payer: Self-pay | Admitting: Adult Health

## 2018-12-09 ENCOUNTER — Encounter: Payer: Self-pay | Admitting: Adult Health

## 2020-09-24 ENCOUNTER — Ambulatory Visit: Payer: Self-pay

## 2020-09-24 ENCOUNTER — Encounter: Payer: Self-pay | Admitting: Orthopaedic Surgery

## 2020-09-24 ENCOUNTER — Ambulatory Visit: Payer: BC Managed Care – PPO | Admitting: Orthopaedic Surgery

## 2020-09-24 DIAGNOSIS — M25512 Pain in left shoulder: Secondary | ICD-10-CM

## 2020-09-24 DIAGNOSIS — M25561 Pain in right knee: Secondary | ICD-10-CM | POA: Diagnosis not present

## 2020-09-24 DIAGNOSIS — G8929 Other chronic pain: Secondary | ICD-10-CM

## 2020-09-24 MED ORDER — LIDOCAINE HCL 1 % IJ SOLN
3.0000 mL | INTRAMUSCULAR | Status: AC | PRN
  Administered 2020-09-24: 3 mL

## 2020-09-24 MED ORDER — METHYLPREDNISOLONE ACETATE 40 MG/ML IJ SUSP
40.0000 mg | INTRAMUSCULAR | Status: AC | PRN
  Administered 2020-09-24: 40 mg via INTRA_ARTICULAR

## 2020-09-24 NOTE — Progress Notes (Signed)
Office Visit Note   Patient: Kristie Burke           Date of Birth: Oct 23, 1966           MRN: [id number] Visit Date: 09/24/2020              Requested by: Lucky Cowboy, MD [address] [place],  [place] [address] PCP: Lucky Cowboy, MD   Assessment & Plan: Visit Diagnoses:  1. Acute pain of right knee   2. Chronic left shoulder pain     Plan: She is given shoulder exercise she is not reviewed these she has bands at home that she can use for the shoulder exercises.  Recommend quad strengthening exercises and weight loss to help with her knees.  See her back in a month if she continues to have any problems with the shoulder or the knee.  Questions were encouraged and answered at length by Dr. Magnus Ivan and myself.  Follow-Up Instructions: Return in about 4 weeks (around 10/22/2020).   Orders:  Orders Placed This Encounter  Procedures  . Large Joint Inj  . XR Shoulder Left  . XR KNEE 3 VIEW RIGHT   No orders of the defined types were placed in this encounter.     Procedures: Large Joint Inj: L subacromial bursa on 09/24/2020 10:01 AM Indications: pain Details: 22 G 1.5 in needle, lateral approach  Arthrogram: No  Medications: 3 mL lidocaine 1 %; 40 mg methylPREDNISolone acetate 40 MG/ML Outcome: tolerated well, no immediate complications Procedure, treatment alternatives, risks and benefits explained, specific risks discussed. Consent was given by the patient. Immediately prior to procedure a time out was called to verify the correct patient, procedure, equipment, support staff and site/side marked as required. Patient was prepped and draped in the usual sterile fashion.       Clinical Data: No additional findings.   Subjective: Chief Complaint  Patient presents with  . Right Knee - Pain  . Left Shoulder - Pain    HPI Kristie Burke is a 54 year old female who comes in today for 2 problems.  #1 right knee pain status post fall in September.   She reports that the sidewalk was uneven and she tripped falling face forward on the cement injuring her right knee.  She notes swelling over the anterior lateral aspect of the knee.  She is having no mechanical symptoms in the knee.  States pain in the knee is unchanged.  She describes her pain as aching pain and tightness laterally.  She is taking Tylenol for this.  No prior knee problems. Left shoulder pain ongoing for years.  Said no treatment.  No known injury.  She notes weakness and slight decreased range of motion of left shoulder.  She has achy pain from the shoulder down her arm denies any numbness tingling down the arm.  She is nondiabetic and has had no recent vaccines.  Review of Systems Denies any fevers chills shortness breath chest pain.  Objective: Vital Signs: There were no vitals taken for this visit.  Physical Exam Constitutional:      Appearance: She is not ill-appearing or diaphoretic.  Pulmonary:     Effort: Pulmonary effort is normal.  Neurological:     Mental Status: She is alert and oriented to person, place, and time.  Psychiatric:        Mood and Affect: Mood normal.     Ortho Exam Bilateral shoulders she has 5-5 strength with external and internal rotation against  resistance.  Impingement testing is positive on the left negative on the right.  Slight weakness with liftoff on the left negative on the right.  Empty can test is negative bilaterally.  Bilateral knees overall good fluid range of motion.  Slight patellofemoral crepitus both knees with passive range of motion.  No instability valgus varus stressing no abnormal warmth erythema or effusion.  Slight soft tissue edema over the anterior lateral proximal patellar region consistent with an old hematoma.  There is no ecchymosis in this region.  Tenderness along the medial joint line of both knees. Specialty Comments:  No specialty comments available.  Imaging: XR KNEE 3 VIEW RIGHT  Result Date:  09/24/2020 Right knee 3 views: No acute fractures.  Mild medial joint line narrowing.  Otherwise no bony abnormalities.  Knee is well located  XR Shoulder Left  Result Date: 09/24/2020 Left shoulder 3 views: Glenohumeral joints well-maintained.  No acute findings.  Mild AC joint arthritic changes.  Shoulder is well located.    PMFS History: Patient Active Problem List   Diagnosis Date Noted  . Obesity 08/03/2014  . Vitamin D deficiency 08/03/2014  . Medication management 08/03/2014  . Elevated blood pressure reading without diagnosis of hypertension 01/20/2014  . Other abnormal glucose   . Hyperlipidemia    Past Medical History:  Diagnosis Date  . Elevated blood pressure reading without diagnosis of hypertension 01/20/2014  . Hyperlipidemia   . Other abnormal glucose     Family History  Problem Relation Age of Onset  . Heart attack Father   . Stroke Father     History reviewed. No pertinent surgical history. Social History   Occupational History  . Not on file  Tobacco Use  . Smoking status: Never Smoker  . Smokeless tobacco: Never Used  Substance and Sexual Activity  . Alcohol use: Not on file  . Drug use: Not on file  . Sexual activity: Not on file

## 2020-10-22 ENCOUNTER — Encounter: Payer: Self-pay | Admitting: Orthopaedic Surgery

## 2020-10-22 ENCOUNTER — Ambulatory Visit: Payer: BC Managed Care – PPO | Admitting: Orthopaedic Surgery

## 2020-10-22 DIAGNOSIS — M25512 Pain in left shoulder: Secondary | ICD-10-CM

## 2020-10-22 DIAGNOSIS — G8929 Other chronic pain: Secondary | ICD-10-CM

## 2020-10-22 DIAGNOSIS — M25561 Pain in right knee: Secondary | ICD-10-CM | POA: Diagnosis not present

## 2020-10-22 NOTE — Progress Notes (Signed)
The patient returns today 1 month after being seen for acute right knee pain after mechanical fall and chronic left shoulder pain that hurts with reaching behind and overhead activities.  She had a steroid injection in her right knee and in the left shoulder subacromial outlet.  She reports that she has just still some soreness but overall is significantly better since the injections.  Examination of her right knee shows no effusion.  Range of motion is full.  Her knee is ligamentously stable.  Examination of her left shoulder does show some crepitation at the [facility] joint and some popping but good range of motion overall and good strength but pain in the subacromial outlet.  She will continue home exercise program with stretching the shoulder.  We can always repeat an injection in the February timeframe if she continues to have problems.  She will let us know.  Follow-up is as needed.  All questions and concerns were answered and addressed.  I would advocate a second injection prior to a MRI in the left shoulder and she agrees with that treatment plan as well.

## 2020-12-13 ENCOUNTER — Other Ambulatory Visit: Payer: Self-pay

## 2020-12-13 ENCOUNTER — Encounter: Payer: Self-pay | Admitting: Family Medicine

## 2020-12-13 ENCOUNTER — Ambulatory Visit (INDEPENDENT_AMBULATORY_CARE_PROVIDER_SITE_OTHER): Payer: BC Managed Care – PPO | Admitting: Family Medicine

## 2020-12-13 VITALS — BP 129/83 | HR 68 | Temp 98.1°F | Ht 62.5 in | Wt 213.8 lb

## 2020-12-13 DIAGNOSIS — R030 Elevated blood-pressure reading, without diagnosis of hypertension: Secondary | ICD-10-CM

## 2020-12-13 DIAGNOSIS — Z7689 Persons encountering health services in other specified circumstances: Secondary | ICD-10-CM | POA: Diagnosis not present

## 2020-12-13 DIAGNOSIS — R7309 Other abnormal glucose: Secondary | ICD-10-CM

## 2020-12-13 DIAGNOSIS — E6609 Other obesity due to excess calories: Secondary | ICD-10-CM

## 2020-12-13 DIAGNOSIS — E785 Hyperlipidemia, unspecified: Secondary | ICD-10-CM | POA: Diagnosis not present

## 2020-12-13 DIAGNOSIS — E559 Vitamin D deficiency, unspecified: Secondary | ICD-10-CM

## 2020-12-13 DIAGNOSIS — J302 Other seasonal allergic rhinitis: Secondary | ICD-10-CM

## 2020-12-13 NOTE — Progress Notes (Signed)
Office Visit Note   Patient: Kristie Burke           Date of Birth: January 31, 1966           MRN: [id number] Visit Date: 12/13/2020 Requested by: Lucky Cowboy, MD [address] [place],  [place] [address] PCP: Lucky Cowboy, MD  Subjective: Chief Complaint  Patient presents with  . Other    Establish primary care    HPI: She is here to establish care.  Her previous PCP is not always available and she is not comfortable with the PAs in the practice.  She has chronic head congestion with occasional sinus infections, she thinks it is from allergies.  She has never been tested.  She takes occasional Benadryl, uses saline sinus rinses and quercetin.  This year she has been taking extra vitamin C and vitamin D3 along with elderberry, she feels like her symptoms are better.  In the past she was told her blood pressure was elevated but this was when she was on Sudafed for head congestion.  Her blood pressure has never been elevated since then in other settings.  At some point her blood sugars were elevated.  She has not had an A1c checked.  She has been vitamin D deficient in the past as well.  She is trying to lose weight, she plans to start a low carbohydrate diet.  She is hoping this will help her chronic knee pain.  She works at [employer].                ROS:   All other systems were reviewed and are negative.  Objective: Vital Signs: BP 129/83 (BP Location: Left Arm, Patient Position: Sitting, Cuff Size: Normal)   Pulse 68   Temp 98.1 F (36.7 C) (Oral)   Ht 5' 2.5" (1.588 m)   Wt 213 lb 12.8 oz (97 kg)   BMI 38.48 kg/m   Physical Exam:  General:  Alert and oriented, in no acute distress. Pulm:  Breathing unlabored. Psy:  Normal mood, congruent affect. Skin: No suspicious lesions Neck: No thyromegaly or nodules, no carotid bruits. CV: Regular rate and rhythm without murmurs, rubs, or gallops.  No peripheral edema.  2+ radial and posterior  tibial pulses. Lungs: Clear to auscultation throughout with no wheezing or areas of consolidation. Extremities: 2+ upper and lower DTRs, no nail deformities.   Imaging: No results found.  Assessment & Plan: 1.  Visit to establish care -We will go ahead and draw labs today since it has been a few years. -She will follow-up annually for wellness exams.  She plans to meet with her gynecologist in the near future.  2.  History of elevated blood pressure, normal today.  3.  History of hyperglycemia -Check labs today.  4.  Chronic head congestion -If symptoms worsen, she will contact me for allergist referral.  5.  Obesity, planning to start low carbohydrate eating.     Procedures: No procedures performed        PMFS History: Patient Active Problem List   Diagnosis Date Noted  . Obesity 08/03/2014  . Vitamin D deficiency 08/03/2014  . Medication management 08/03/2014  . Other abnormal glucose   . Hyperlipidemia    Past Medical History:  Diagnosis Date  . Elevated blood pressure reading without diagnosis of hypertension 01/20/2014  . Hyperlipidemia   . Other abnormal glucose     Family History  Problem Relation Age of Onset  . Osteoporosis Mother   .  Heart attack Father   . Stroke Father   . Heart attack Paternal Uncle   . Stroke Paternal Uncle   . Diabetes Maternal Grandmother   . Cancer Maternal Grandmother        ? colon    History reviewed. No pertinent surgical history. Social History   Occupational History  . Not on file  Tobacco Use  . Smoking status: Never Smoker  . Smokeless tobacco: Never Used  Substance and Sexual Activity  . Alcohol use: Not on file  . Drug use: Not on file  . Sexual activity: Not on file

## 2020-12-14 LAB — CBC WITH DIFFERENTIAL/PLATELET
Absolute Monocytes: 763 cells/uL (ref 200–950)
Basophils Absolute: 37 cells/uL (ref 0–200)
Basophils Relative: 0.6 %
Eosinophils Absolute: 192 cells/uL (ref 15–500)
Eosinophils Relative: 3.1 %
HCT: 40.2 % (ref 35.0–45.0)
Hemoglobin: 13.6 g/dL (ref 11.7–15.5)
Lymphs Abs: 1525 cells/uL (ref 850–3900)
MCH: 30 pg (ref 27.0–33.0)
MCHC: 33.8 g/dL (ref 32.0–36.0)
MCV: 88.7 fL (ref 80.0–100.0)
MPV: 11.5 fL (ref 7.5–12.5)
Monocytes Relative: 12.3 %
Neutro Abs: 3683 cells/uL (ref 1500–7800)
Neutrophils Relative %: 59.4 %
Platelets: 362 10*3/uL (ref 140–400)
RBC: 4.53 10*6/uL (ref 3.80–5.10)
RDW: 13 % (ref 11.0–15.0)
Total Lymphocyte: 24.6 %
WBC: 6.2 10*3/uL (ref 3.8–10.8)

## 2020-12-14 LAB — THYROID PANEL WITH TSH
Free Thyroxine Index: 2.3 (ref 1.4–3.8)
T3 Uptake: 30 % (ref 22–35)
T4, Total: 7.6 ug/dL (ref 5.1–11.9)
TSH: 1.24 mIU/L (ref 0.40–4.50)

## 2020-12-14 LAB — HEMOGLOBIN A1C
Hgb A1c MFr Bld: 5.6 % of total Hgb (ref <5.7)
Mean Plasma Glucose: 114 mg/dL
eAG (mmol/L): 6.3 mmol/L

## 2020-12-14 LAB — COMPREHENSIVE METABOLIC PANEL
AG Ratio: 1.7 (calc) (ref 1.0–2.5)
ALT: 17 U/L (ref 6–29)
AST: 23 U/L (ref 10–35)
Albumin: 4.5 g/dL (ref 3.6–5.1)
Alkaline phosphatase (APISO): 84 U/L (ref 37–153)
BUN: 17 mg/dL (ref 7–25)
CO2: 31 mmol/L (ref 20–32)
Calcium: 9.6 mg/dL (ref 8.6–10.4)
Chloride: 103 mmol/L (ref 98–110)
Creat: 0.85 mg/dL (ref 0.50–1.05)
Globulin: 2.6 g/dL (calc) (ref 1.9–3.7)
Glucose, Bld: 96 mg/dL (ref 65–99)
Potassium: 4.3 mmol/L (ref 3.5–5.3)
Sodium: 143 mmol/L (ref 135–146)
Total Bilirubin: 0.5 mg/dL (ref 0.2–1.2)
Total Protein: 7.1 g/dL (ref 6.1–8.1)

## 2020-12-14 LAB — VITAMIN D 25 HYDROXY (VIT D DEFICIENCY, FRACTURES): Vit D, 25-Hydroxy: 64 ng/mL (ref 30–100)

## 2020-12-14 LAB — LIPID PANEL
Cholesterol: 231 mg/dL — ABNORMAL HIGH (ref <200)
HDL: 63 mg/dL (ref >=50)
LDL Cholesterol (Calc): 144 mg/dL (calc) — ABNORMAL HIGH
Non-HDL Cholesterol (Calc): 168 mg/dL (calc) — ABNORMAL HIGH (ref <130)
Total CHOL/HDL Ratio: 3.7 (calc) (ref <5.0)
Triglycerides: 119 mg/dL (ref <150)

## 2020-12-14 LAB — HIGH SENSITIVITY CRP: hs-CRP: 7.7 mg/L — ABNORMAL HIGH

## 2020-12-16 ENCOUNTER — Telehealth: Payer: Self-pay | Admitting: Family Medicine

## 2020-12-16 NOTE — Telephone Encounter (Signed)
Labs show:  Thyroid studies are normal.  CBC and metabolic panel look normal.  CRP, inflammation marker, is elevated at 7.7.  Generally, regular exercise along with dietary minimization of breads; pastas; cereals; sugars/sweets will improve this.  Recheck in 3-6 months and if still elevated, we'll do additional tests.  Vitamin D is perfect at 64.  Keep taking current dosage.  A1C and glucose are still good.  Lipids are elevated.  Lifestyle changes mentioned above should help decrease cardiac risk.

## 2020-12-17 NOTE — Telephone Encounter (Signed)
I called and left voice mail advising patient that this results message has been sent to her. Advised her to call me back for the results if she is having difficulty accessing MyChart.

## 2021-03-21 ENCOUNTER — Telehealth: Payer: Self-pay | Admitting: Family Medicine

## 2021-03-21 MED ORDER — AZITHROMYCIN 250 MG PO TABS: ORAL_TABLET | ORAL | 0 refills | Status: AC

## 2021-03-21 NOTE — Telephone Encounter (Signed)
I called and advised the patient's husband, Casimiro Needle. Advised to call us back if not improving/worsens.

## 2021-03-21 NOTE — Telephone Encounter (Signed)
Zithromax Rx sent.

## 2021-03-21 NOTE — Telephone Encounter (Signed)
I called and spoke with Talbert Forest: started having sinus congestion, sore throat and body aches 2 days ago. No fever. Blowing yellowish-green mucus from her nose --- she has started doing nasal rinses. Has developed a cough p/o yellowish phlegm, with "burning" in the chest from the coughing. No GI issues. Has not been tested for covid. H/o bronchitis starting out this way. Allergy to Augmentin and Levaquin. Please advise.

## 2021-03-21 NOTE — Telephone Encounter (Signed)
Patient's husband Casimiro Needle called requesting an appt. He states wife could have a possible sinus infection. Pt has not tested for covid. They would like to set an appt. Explained pt may possible need to test for covid 1st just in case. Pt is asking for a call back to see if pt needs to test for covid or not before setting appt. Phone number is [phone]  [phone].

## 2021-03-26 MED ORDER — METHYLPREDNISOLONE 4 MG PO TBPK: ORAL_TABLET | ORAL | 0 refills | Status: DC

## 2021-03-26 MED ORDER — DOXYCYCLINE HYCLATE 100 MG PO CAPS: 100.0000 mg | ORAL_CAPSULE | Freq: Two times a day (BID) | ORAL | 0 refills | Status: AC

## 2021-03-26 NOTE — Addendum Note (Signed)
Addended by: Lillia Carmel on: 03/26/2021 04:54 PM   Modules accepted: Orders

## 2021-03-26 NOTE — Telephone Encounter (Signed)
I called and advised the patient of the new meds sent to the pharmacy.

## 2021-03-26 NOTE — Telephone Encounter (Signed)
Will call in doxy and a medrol pack.

## 2021-03-26 NOTE — Telephone Encounter (Signed)
Please advise 

## 2021-03-26 NOTE — Telephone Encounter (Signed)
Pt called stating that she has had a small amount of improvement. But she still has a lot of mucus

## 2021-06-15 ENCOUNTER — Emergency Department (HOSPITAL_COMMUNITY)
Admission: EM | Admit: 2021-06-15 | Discharge: 2021-06-15 | Disposition: A | Discharge status: Home / Self Care | Payer: BC Managed Care – PPO | Attending: Emergency Medicine | Admitting: Emergency Medicine

## 2021-06-15 ENCOUNTER — Encounter (HOSPITAL_COMMUNITY): Payer: Self-pay | Admitting: Emergency Medicine

## 2021-06-15 ENCOUNTER — Other Ambulatory Visit: Payer: Self-pay

## 2021-06-15 ENCOUNTER — Emergency Department (HOSPITAL_COMMUNITY): Payer: BC Managed Care – PPO

## 2021-06-15 DIAGNOSIS — M5442 Lumbago with sciatica, left side: Secondary | ICD-10-CM | POA: Insufficient documentation

## 2021-06-15 DIAGNOSIS — M545 Low back pain, unspecified: Secondary | ICD-10-CM | POA: Diagnosis present

## 2021-06-15 DIAGNOSIS — M5432 Sciatica, left side: Secondary | ICD-10-CM

## 2021-06-15 MED ORDER — METHOCARBAMOL 500 MG PO TABS: 500.0000 mg | ORAL_TABLET | Freq: Two times a day (BID) | ORAL | 0 refills | Status: AC

## 2021-06-15 MED ORDER — DEXAMETHASONE SODIUM PHOSPHATE 10 MG/ML IJ SOLN
10.0000 mg | Freq: Once | INTRAMUSCULAR | Status: AC
  Administered 2021-06-15: 10 mg via INTRAMUSCULAR
  Filled 2021-06-15: qty 1

## 2021-06-15 MED ORDER — TRAMADOL HCL 50 MG PO TABS: 50.0000 mg | ORAL_TABLET | Freq: Four times a day (QID) | ORAL | 0 refills | Status: AC | PRN

## 2021-06-15 MED ORDER — KETOROLAC TROMETHAMINE 60 MG/2ML IM SOLN
60.0000 mg | Freq: Once | INTRAMUSCULAR | Status: AC
  Administered 2021-06-15: 60 mg via INTRAMUSCULAR
  Filled 2021-06-15: qty 2

## 2021-06-15 MED ORDER — CELECOXIB 200 MG PO CAPS: 200.0000 mg | ORAL_CAPSULE | Freq: Two times a day (BID) | ORAL | 0 refills | Status: AC

## 2021-06-15 MED ORDER — METHYLPREDNISOLONE 4 MG PO TBPK: ORAL_TABLET | ORAL | 0 refills | Status: AC

## 2021-06-15 NOTE — Discharge Instructions (Addendum)
SEEK IMMEDIATE MEDICAL ATTENTION IF: New numbness, tingling, weakness, or problem with the use of your arms or legs.  Severe back pain not relieved with medications.  Change in bowel or bladder control.  Increasing pain in any areas of the body (such as chest or abdominal pain).  Shortness of breath, dizziness or fainting.  Nausea (feeling sick to your stomach), vomiting, fever, or sweats.  

## 2021-06-15 NOTE — ED Provider Notes (Signed)
Emergency Medicine Provider Triage Evaluation Note  Kristie Burke , a 55 y.o. female  was evaluated in triage.  Pt complains of back painEmergency Medicine Provider Triage Evaluation Note  Kristie Burke , a 55 y.o. female  was evaluated in triage.  Pt complains of left lumbar back pain.  Pain started yesterday.  Patient was able to go to work today however pain became progressively worse.  Pain located to left lumbar back and radiates into left hip.  Patient denies any recent falls or injuries.  Patient denies any numbness, weakness, saddle anesthesia, difficulty urinating, dysuria, hematuria, urinary frequency, abdominal pain, nausea, vomiting, fevers.   Review of Systems  Positive: Back pain Negative: numbness, weakness, saddle anesthesia, difficulty urinating, dysuria, hematuria, urinary frequency, abdominal pain, nausea, vomiting, fevers  Physical Exam  BP (!) 154/92 (BP Location: Left Arm)   Pulse 91   Temp 98.3 F (36.8 C) (Oral)   Resp 20   Ht 5' 2.5" (1.588 m) Comment: Simultaneous filing. User may not have seen previous data.  Wt 96.6 kg Comment: Simultaneous filing. User may not have seen previous data.  SpO2 100%   BMI 38.34 kg/m  Gen:   Awake, no distress   Resp:  Normal effort  MSK:   Moves extremities without difficulty  Other:  Tenderness to left lumbar back , no midline tenderness. :   Medical Decision Making  Medically screening exam initiated at 7:44 PM.  Appropriate orders placed.  Willodean Rosenthal Gieger was informed that the remainder of the evaluation will be completed by another provider, this initial triage assessment does not replace that evaluation, and the importance of remaining in the ED until their evaluation is complete.  The patient appears stable so that the remainder of the work up may be completed by another provider.      Berneice Heinrich 06/15/21 1953    Jacalyn Lefevre, MD 06/15/21 2014

## 2021-06-15 NOTE — ED Provider Notes (Signed)
[facility]-EMERGENCY DEPT Provider Note   CSN: [id number] Arrival date & time: 06/15/21  1859     History Chief Complaint  Patient presents with   Back Pain   Hip Pain    Kristie Burke is a 55 y.o. female presents emergency department chief complaint of severe left low back pain radiating into her left leg and the sciatic distribution.  Patient states that she is having some achiness on that side yesterday morning woke up with fairly severe pain in her left low back radiating down the left leg.  She states she went ahead and went to work however throughout the day the pain became worse and worse.  She denies any numbness but states that at times she has almost an electrical sensation right radiating down the leg.  She has no foot drop.  She denies any saddle anesthesia, loss of control of her bowel or bladder.  She is never had anything like this before.  She did not try any medications prior to arrival.   Back Pain Associated symptoms: no fever, no numbness and no weakness   Hip Pain      Past Medical History:  Diagnosis Date   Elevated blood pressure reading without diagnosis of hypertension 01/20/2014   Hyperlipidemia    Other abnormal glucose     Patient Active Problem List   Diagnosis Date Noted   Obesity 08/03/2014   Vitamin D deficiency 08/03/2014   Medication management 08/03/2014   Other abnormal glucose    Hyperlipidemia     History reviewed. No pertinent surgical history.   OB History   No obstetric history on file.     Family History  Problem Relation Age of Onset   Osteoporosis Mother    Heart attack Father    Stroke Father    Heart attack Paternal Uncle    Stroke Paternal Uncle    Diabetes Maternal Grandmother    Cancer Maternal Grandmother        ? colon    Social History   Tobacco Use   Smoking status: Never   Smokeless tobacco: Never    Home Medications Prior to Admission medications   Medication Sig Start  Date End Date Taking? Authorizing Provider  celecoxib (CELEBREX) 200 MG capsule Take 1 capsule (200 mg total) by mouth 2 (two) times daily. 06/15/21  Yes Shantella Blubaugh, PA-C  methocarbamol (ROBAXIN) 500 MG tablet Take 1 tablet (500 mg total) by mouth 2 (two) times daily. 06/15/21  Yes Arthor Captain, PA-C  methylPREDNISolone (MEDROL DOSEPAK) 4 MG TBPK tablet Use as directed 06/15/21  Yes Brighton Pilley, PA-C  traMADol (ULTRAM) 50 MG tablet Take 1 tablet (50 mg total) by mouth every 6 (six) hours as needed for severe pain. 06/15/21  Yes Dianna Deshler, PA-C  azithromycin (ZITHROMAX) 250 MG tablet 2 PO x 1 day, then 1 PO qd x 4 more days. 03/21/21   Hilts, Casimiro Needle, MD  calcium-vitamin D (OSCAL WITH D) 500-200 MG-UNIT tablet Take 1 tablet by mouth.    [provider]  Cholecalciferol (VITAMIN D PO) Take 5,000 Units daily by mouth.    [provider]  cyanocobalamin 2000 MCG tablet Take 2,000 mcg by mouth daily.    [provider]  doxycycline (VIBRAMYCIN) 100 MG capsule Take 1 capsule (100 mg total) by mouth 2 (two) times daily. 03/26/21   Hilts, Casimiro Needle, MD  ELDERBERRY PO Take by mouth.    [provider]  glucosamine-chondroitin 500-400 MG tablet Take  1 tablet by mouth 3 (three) times daily.    [provider]  Magnesium Oxide 500 MG TABS Take 1 tablet daily by mouth.    [provider]    Allergies    Augmentin [amoxicillin-pot clavulanate] and Levaquin [levofloxacin in d5w]  Review of Systems   Review of Systems  Constitutional:  Negative for chills and fever.  Genitourinary: Negative.   Musculoskeletal:  Positive for back pain.  Neurological:  Negative for weakness and numbness.   Physical Exam Updated Vital Signs BP (!) 154/92 (BP Location: Left Arm)   Pulse 91   Temp 98.3 F (36.8 C) (Oral)   Resp 20   Ht 5' 2.5" (1.588 m) Comment: Simultaneous filing. User may not have seen previous data.  Wt 96.6 kg Comment: Simultaneous filing.  User may not have seen previous data.  LMP 09/08/2015   SpO2 100%   BMI 38.34 kg/m   Physical Exam Vitals and nursing note reviewed.  Constitutional:      General: She is not in acute distress.    Appearance: She is well-developed. She is not diaphoretic.  HENT:     Head: Normocephalic and atraumatic.     Right Ear: External ear normal.     Left Ear: External ear normal.     Nose: Nose normal.     Mouth/Throat:     Mouth: Mucous membranes are moist.  Eyes:     General: No scleral icterus.    Conjunctiva/sclera: Conjunctivae normal.  Cardiovascular:     Rate and Rhythm: Normal rate and regular rhythm.     Heart sounds: Normal heart sounds. No murmur heard.   No friction rub. No gallop.  Pulmonary:     Effort: Pulmonary effort is normal. No respiratory distress.     Breath sounds: Normal breath sounds.  Abdominal:     General: Bowel sounds are normal. There is no distension.     Palpations: Abdomen is soft. There is no mass.     Tenderness: There is no abdominal tenderness. There is no guarding.  Musculoskeletal:     Cervical back: Normal range of motion.     Comments: Patient appears to be in mild to moderate pain, antalgic gait noted. Lumbosacral spine area reveals no local tenderness or mass. Painful and reduced LS ROM noted. Straight leg raise is positive at 30 degrees on left. DTR's, motor strength and sensation normal, including heel and toe gait.  Peripheral pulses are palpable.   Skin:    General: Skin is warm and dry.  Neurological:     Mental Status: She is alert and oriented to person, place, and time.  Psychiatric:        Behavior: Behavior normal.    ED Results / Procedures / Treatments   Labs (all labs ordered are listed, but only abnormal results are displayed) Labs Reviewed - No data to display  EKG None  Radiology DG Lumbar Spine Complete  Result Date: 06/15/2021 CLINICAL DATA:  Low back pain EXAM: LUMBAR SPINE - COMPLETE 4+ VIEW COMPARISON:  None.  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 Normal alignment. No fracture. SI joints symmetric and unremarkable. 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 No acute bony abnormality. Electronically Signed   By: Charlett Nose M.D.   On: 06/15/2021 20:45    Procedures Procedures   Medications Ordered in ED Medications  dexamethasone (DECADRON) injection 10 mg (10 mg Intramuscular Given 06/15/21 2127)  ketorolac (TORADOL) injection 60 mg (  60 mg Intramuscular Given 06/15/21 2127)    ED Course  I have reviewed the triage vital signs and the nursing notes.  Pertinent labs & imaging results that were available during my care of the patient were reviewed by me and considered in my medical decision making (see chart for details).    MDM Rules/Calculators/A&P                           Patient here with low back pain, radiating down the left leg.  She has a positive straight leg test and her symptoms are consistent with acute sciatica.  No foot drop or red flag symptoms.  I reviewed a lumbar spine plain film which shows no acute abnormalities.  Patient given a shot of Decadron and Toradol here with improvement in her symptoms.  She will be discharged with the medications listed below.  Discussed outpatient follow-up and return precautions. Final Clinical Impression(s) / ED Diagnoses Final diagnoses:  Sciatica of left side    Rx / DC Orders ED Discharge Orders          Ordered    methylPREDNISolone (MEDROL DOSEPAK) 4 MG TBPK tablet        06/15/21 2133    celecoxib (CELEBREX) 200 MG capsule  2 times daily        06/15/21 2133    methocarbamol (ROBAXIN) 500 MG tablet  2 times daily        06/15/21 2133    traMADol (ULTRAM) 50 MG tablet  Every 6 hours PRN        06/15/21 2133             Arthor Captain, PA-C 06/15/21 2137    Benjiman Core, MD 06/15/21 2340

## 2021-06-15 NOTE — ED Triage Notes (Signed)
Pt c/o left sided back pain that radiates to left hip. Pain began yesterday. Denies injury. Pt took tylenol with no relief. Pt reports fall ~3 months ago where she landed on left knee.

## 2021-06-16 ENCOUNTER — Ambulatory Visit: Payer: BC Managed Care – PPO | Admitting: Orthopaedic Surgery

## 2021-06-16 ENCOUNTER — Encounter: Payer: Self-pay | Admitting: Orthopaedic Surgery

## 2021-06-16 ENCOUNTER — Other Ambulatory Visit: Payer: Self-pay

## 2021-06-16 DIAGNOSIS — M5442 Lumbago with sciatica, left side: Secondary | ICD-10-CM

## 2021-06-16 DIAGNOSIS — M545 Low back pain, unspecified: Secondary | ICD-10-CM | POA: Insufficient documentation

## 2021-06-16 NOTE — Progress Notes (Signed)
Office Visit Note   Patient: Kristie Burke           Date of Birth: 07-Jan-1966           MRN: [id number] Visit Date: 06/16/2021              Requested by: Lavada Mesi, MD [address] [place],  [place] [address] PCP: Lavada Mesi, MD   Assessment & Plan: Visit Diagnoses:  1. Acute left-sided low back pain with left-sided sciatica     Plan: Work slip given no work x1 week.  Encouraged her to pick up her prescriptions take the Medrol Dosepak as instructed.  No evidence of motor deficit.  She likely has a bulging disc with nerve root irritation.  Recheck 1 week.  Follow-Up Instructions: Return in about 1 week (around 06/23/2021).   Orders:  No orders of the defined types were placed in this encounter.  No orders of the defined types were placed in this encounter.     Procedures: No procedures performed   Clinical Data: No additional findings.   Subjective: Chief Complaint  Patient presents with   Lower Back - Pain    HPI 55 year old female seen with a 2-day history of severe back pain left buttock pain that radiates down just to her knee rated as severe.  She was seen in emergency room at [facility] had Decadron and Toradol injection.  Robaxin tramadol Celebrex.  Patient has not picked up the Medrol Dosepak and other prescriptions.  She went back to work yesterday and having trouble sleeping spasms difficulty walking.  No past history of back problems.  Plain radiographs in the emergency room did show lumbar facet degenerative changes without spondylolisthesis.  No associated bowel or bladder symptoms.  Review of Systems postural rub BMI 38.  Hyperlipidemia.  All other systems noncontributory to HPI.  No fever chills no bowel or bladder associated symptoms.   Objective: Vital Signs: BP 136/86   Pulse 93   Ht 5' 2.5" (1.588 m)   Wt 213 lb (96.6 kg)   LMP 09/08/2015   BMI 38.34 kg/m   Physical Exam Constitutional:      Appearance: She is well-developed.   HENT:     Head: Normocephalic.     Right Ear: External ear normal.     Left Ear: External ear normal. There is no impacted cerumen.  Eyes:     Pupils: Pupils are equal, round, and reactive to light.  Neck:     Thyroid: No thyromegaly.     Trachea: No tracheal deviation.  Cardiovascular:     Rate and Rhythm: Normal rate.  Pulmonary:     Effort: Pulmonary effort is normal.  Abdominal:     Palpations: Abdomen is soft.  Musculoskeletal:     Cervical back: No rigidity.  Skin:    General: Skin is warm and dry.  Neurological:     Mental Status: She is alert and oriented to person, place, and time.  Psychiatric:        Behavior: Behavior normal.    Ortho Exam negative straight leg raising right left anterior tib gastrocsoleus intact.  She has sciatic notch tenderness on the left some trochanteric bursal tenderness and tenderness over the left SI joint.  Negative FABER test.  Negative logroll the hips.  Knee and ankle jerk are 2+ and symmetrical.  Antalgic gait with left lower extremity limp.  Specialty Comments:  No specialty comments available.  Imaging: DG Lumbar Spine Complete  Result  Date: 06/15/2021 CLINICAL DATA:  Low back pain EXAM: LUMBAR SPINE - COMPLETE 4+ VIEW COMPARISON:  None. 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 Normal alignment. No fracture. SI joints symmetric and unremarkable. 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 No acute bony abnormality. Electronically Signed   By: Charlett Nose M.D.   On: 06/15/2021 20:45     PMFS History: Patient Active Problem List   Diagnosis Date Noted   Low back pain 06/16/2021   Obesity 08/03/2014   Vitamin D deficiency 08/03/2014   Medication management 08/03/2014   Other abnormal glucose    Hyperlipidemia    Past Medical History:  Diagnosis Date   Elevated blood pressure reading without diagnosis of hypertension 01/20/2014   Hyperlipidemia    Other abnormal glucose      Family History  Problem Relation Age of Onset   Osteoporosis Mother    Heart attack Father    Stroke Father    Heart attack Paternal Uncle    Stroke Paternal Uncle    Diabetes Maternal Grandmother    Cancer Maternal Grandmother        ? colon    No past surgical history on file. Social History   Occupational History   Not on file  Tobacco Use   Smoking status: Never   Smokeless tobacco: Never  Substance and Sexual Activity   Alcohol use: Not on file   Drug use: Not on file   Sexual activity: Not on file

## 2021-06-24 ENCOUNTER — Encounter: Payer: Self-pay | Admitting: Orthopaedic Surgery

## 2021-06-24 ENCOUNTER — Ambulatory Visit (INDEPENDENT_AMBULATORY_CARE_PROVIDER_SITE_OTHER): Payer: BC Managed Care – PPO | Admitting: Orthopaedic Surgery

## 2021-06-24 ENCOUNTER — Other Ambulatory Visit: Payer: Self-pay

## 2021-06-24 VITALS — BP 138/89 | HR 81 | Ht 63.0 in | Wt 210.0 lb

## 2021-06-24 DIAGNOSIS — M5442 Lumbago with sciatica, left side: Secondary | ICD-10-CM | POA: Diagnosis not present

## 2021-06-24 NOTE — Progress Notes (Signed)
Office Visit Note   Patient: Kristie Burke           Date of Birth: [birth year]           MRN: [id number] Visit Date: 06/24/2021              Requested by: Lavada Mesi, MD [address] [place],  [place] [address] PCP: Lavada Mesi, MD   Assessment & Plan: Visit Diagnoses:  1. Acute left-sided low back pain with left-sided sciatica     Plan: Continue walking program recheck 1 month.  She states she is greater than 75% improved and is happy with results of treatment.  Follow-Up Instructions: Return in about 1 month (around 07/25/2021).   Orders:  No orders of the defined types were placed in this encounter.  No orders of the defined types were placed in this encounter.     Procedures: No procedures performed   Clinical Data: No additional findings.   Subjective: Chief Complaint  Patient presents with   Lower Back - Follow-up    HPI 55 year old female returns for follow-up of significant back pain left side leg pain.  Patient took the Medrol Dosepak and states pain is now only soreness.  No history of falls or injury.  Pain significantly improved after the Medrol Dosepak.  She is also used heat ice resting.  No bowel bladder symptoms no weakness in her legs.  Review of Systems updated unchanged from 06/16/2021.   Objective: Vital Signs: BP 138/89 (BP Location: Left Arm, Patient Position: Sitting, Cuff Size: Large)   Pulse 81   Ht 5\' 3"  (1.6 m)   Wt 210 lb (95.3 kg)   LMP 09/08/2015   SpO2 98%   BMI 37.20 kg/m   Physical Exam Constitutional:      Appearance: She is well-developed.  HENT:     Head: Normocephalic.     Right Ear: External ear normal.     Left Ear: External ear normal. There is no impacted cerumen.  Eyes:     Pupils: Pupils are equal, round, and reactive to light.  Neck:     Thyroid: No thyromegaly.     Trachea: No tracheal deviation.  Cardiovascular:     Rate and Rhythm: Normal rate.  Pulmonary:     Effort: Pulmonary effort is  normal.  Abdominal:     Palpations: Abdomen is soft.  Musculoskeletal:     Cervical back: No rigidity.  Skin:    General: Skin is warm and dry.  Neurological:     Mental Status: She is alert and oriented to person, place, and time.  Psychiatric:        Behavior: Behavior normal.    Ortho Exam negative logroll the hips.  She had some sitting standing significantly faster.  Normal heel toe gait.  No sciatic notch tenderness.  Knee and ankle jerk are intact.  Minimal discomfort with hip flexion resisted testing.  Specialty Comments:  No specialty comments available.  Imaging: No results found.   PMFS History: Patient Active Problem List   Diagnosis Date Noted   Low back pain 06/16/2021   Obesity 08/03/2014   Vitamin D deficiency 08/03/2014   Medication management 08/03/2014   Other abnormal glucose    Hyperlipidemia    Past Medical History:  Diagnosis Date   Elevated blood pressure reading without diagnosis of hypertension 01/20/2014   Hyperlipidemia    Other abnormal glucose     Family History  Problem Relation Age of Onset  Osteoporosis Mother    Heart attack Father    Stroke Father    Heart attack Paternal Uncle    Stroke Paternal Uncle    Diabetes Maternal Grandmother    Cancer Maternal Grandmother        ? colon    History reviewed. No pertinent surgical history. Social History   Occupational History   Not on file  Tobacco Use   Smoking status: Never   Smokeless tobacco: Never  Substance and Sexual Activity   Alcohol use: Not on file   Drug use: Not on file   Sexual activity: Not on file

## 2021-12-12 ENCOUNTER — Ambulatory Visit: Payer: BC Managed Care – PPO | Admitting: Family Medicine

## 2022-01-23 IMAGING — CR DG LUMBAR SPINE COMPLETE 4+V
7 series · 7 of 7 positions shown · non-contrast
Comparison: None.

CLINICAL DATA: Low back pain

EXAM:
LUMBAR SPINE - COMPLETE 4+ VIEW

[t lumbar spine ap (1 of 3)]
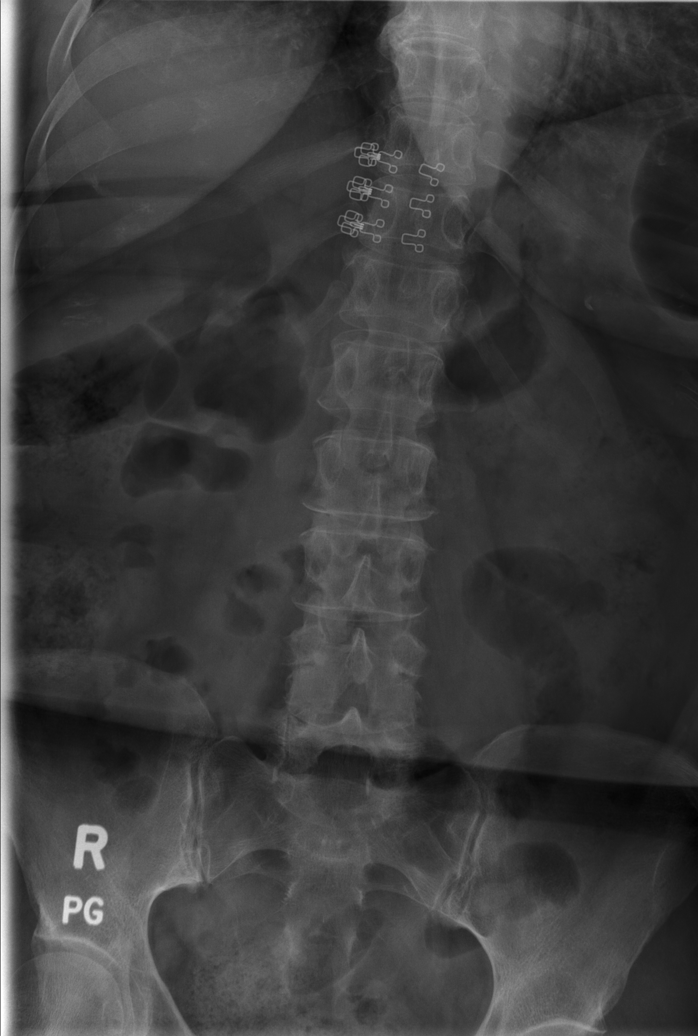

[t lumbar spine ap (2 of 3)]
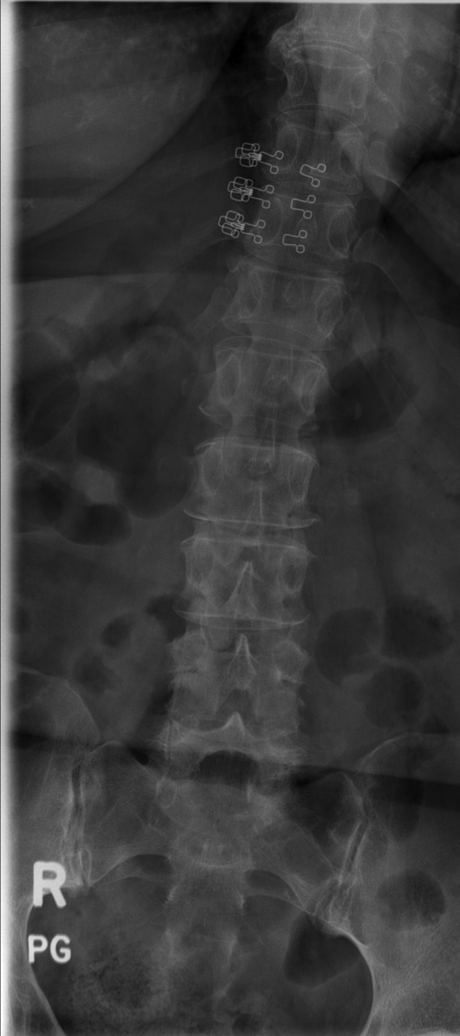

[t lumbar spine ap (3 of 3)]
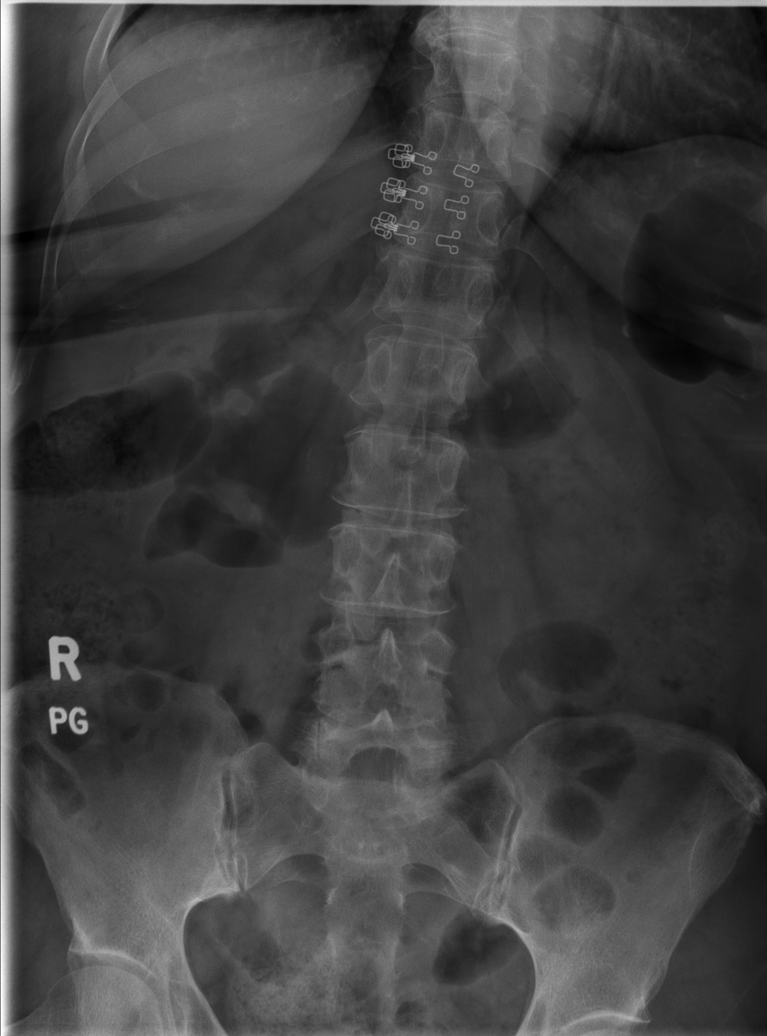

[t lumbar spine obl (1 of 2)]
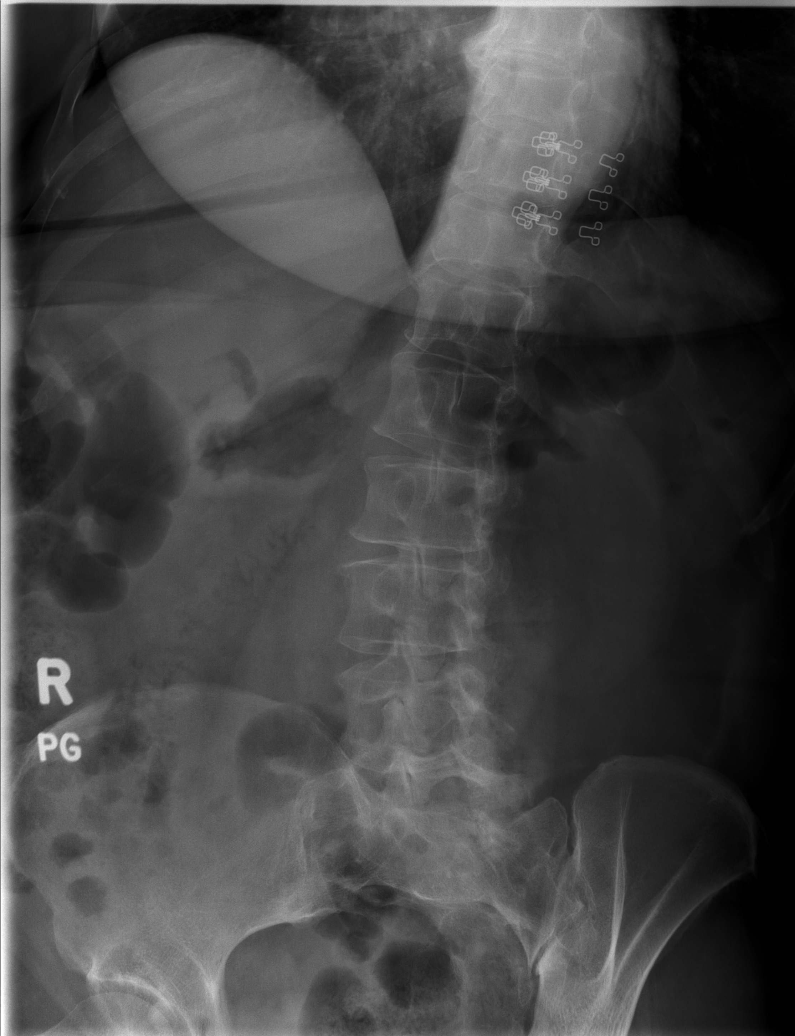

[t lumbar spine obl (2 of 2)]
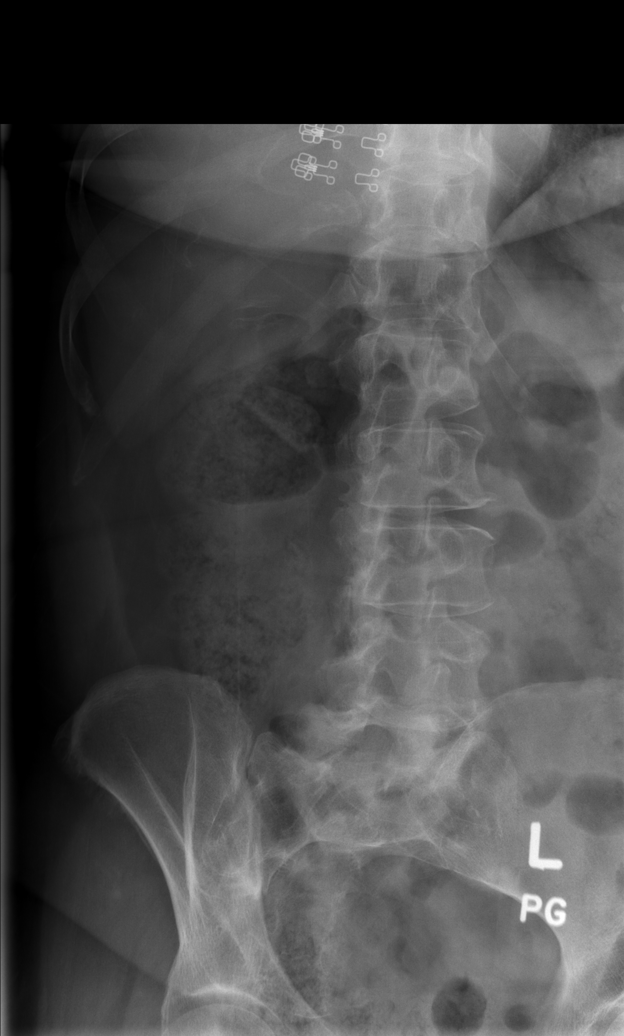

[t lumbar spine lat]
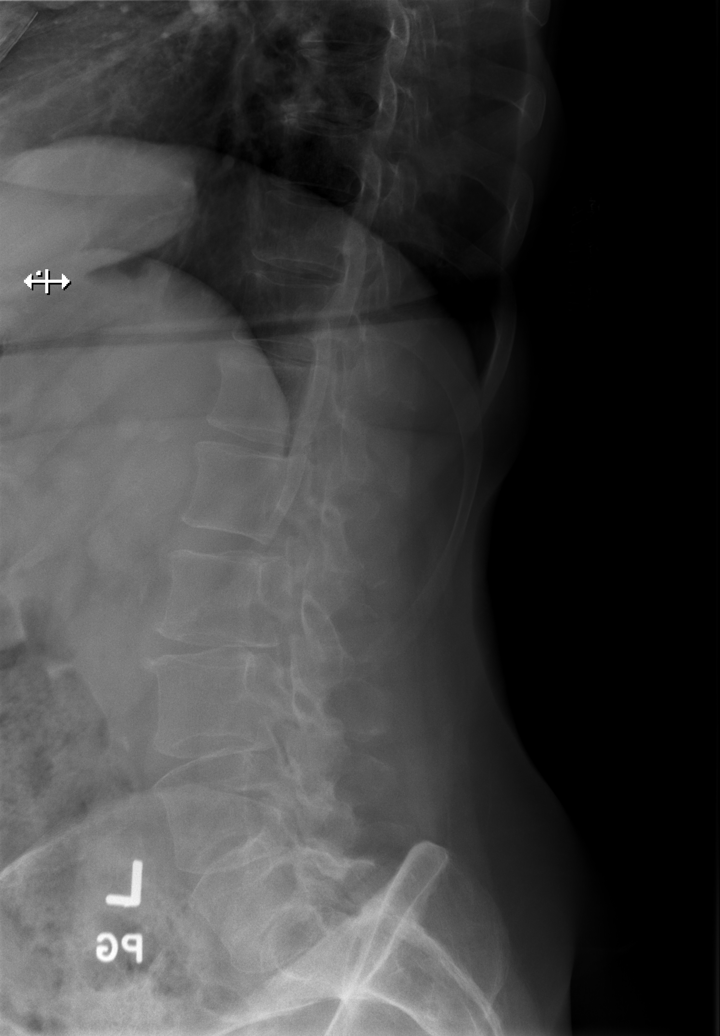

[t lumbar l-5 s-1 spot]
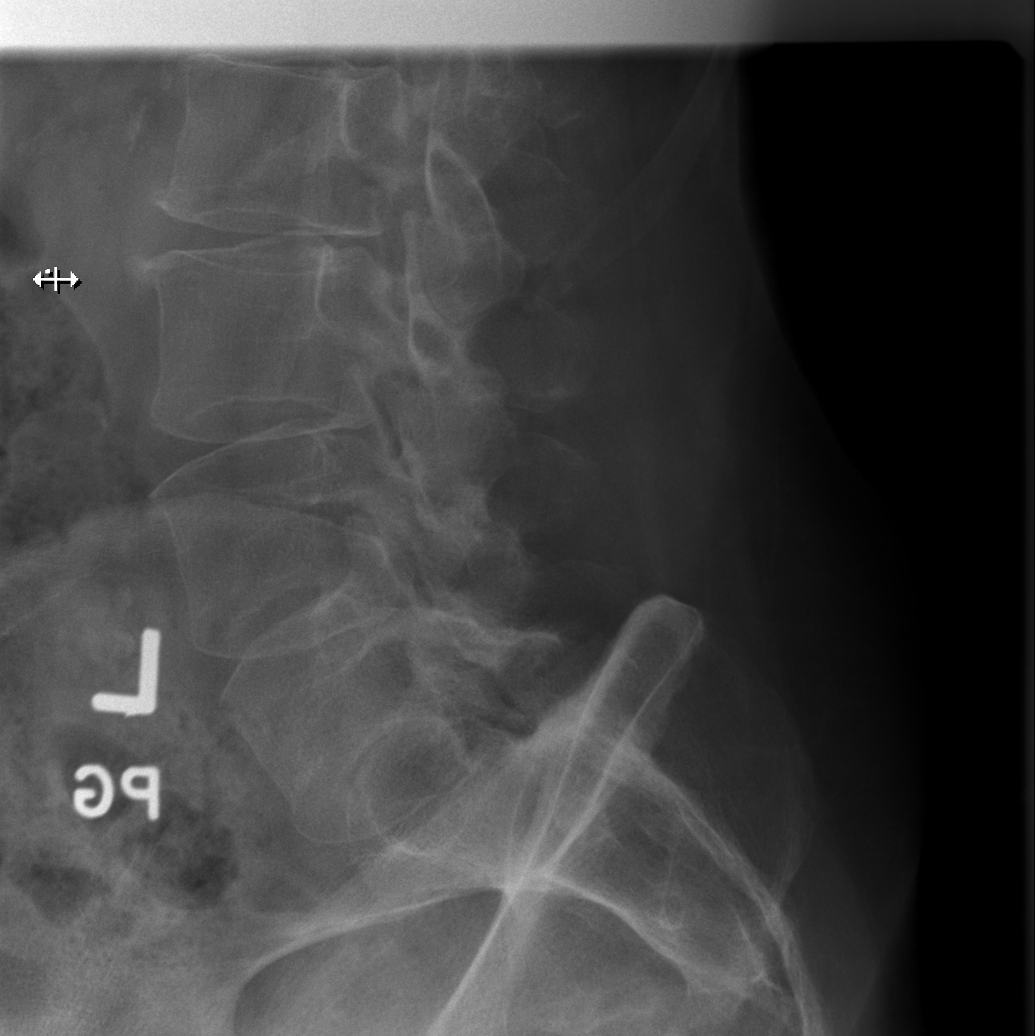

[7 of 7 positions shown; findings below may reference images not displayed]

FINDINGS: Degenerative facet disease in the lower lumbar spine. Normal
alignment. No fracture. SI joints symmetric and unremarkable.
IMPRESSION: Degenerative facet disease in the lower lumbar spine.

No acute bony abnormality.
